# Patient Record
Sex: Female | Born: 1935 | Race: White | Hispanic: No | Smoking: Never smoker
Health system: Southern US, Community
[De-identification: ages and names within clinical notes are randomized; demographics above are authoritative.]

## PROBLEM LIST (undated history)

## (undated) DIAGNOSIS — E785 Hyperlipidemia, unspecified: Secondary | ICD-10-CM

## (undated) DIAGNOSIS — N183 Chronic kidney disease, stage 3 unspecified: Secondary | ICD-10-CM

## (undated) DIAGNOSIS — D469 Myelodysplastic syndrome, unspecified: Secondary | ICD-10-CM

## (undated) DIAGNOSIS — K254 Chronic or unspecified gastric ulcer with hemorrhage: Secondary | ICD-10-CM

## (undated) DIAGNOSIS — K449 Diaphragmatic hernia without obstruction or gangrene: Secondary | ICD-10-CM

## (undated) DIAGNOSIS — D473 Essential (hemorrhagic) thrombocythemia: Secondary | ICD-10-CM

## (undated) DIAGNOSIS — I10 Essential (primary) hypertension: Secondary | ICD-10-CM

## (undated) DIAGNOSIS — R42 Dizziness and giddiness: Secondary | ICD-10-CM

## (undated) DIAGNOSIS — M47816 Spondylosis without myelopathy or radiculopathy, lumbar region: Secondary | ICD-10-CM

## (undated) HISTORY — PX: SHOULDER SURGERY: SHX246

## (undated) HISTORY — DX: Chronic kidney disease, stage 3 unspecified: N18.30

## (undated) HISTORY — PX: HERNIA REPAIR: SHX51

## (undated) HISTORY — DX: Essential (primary) hypertension: I10

## (undated) HISTORY — DX: Spondylosis without myelopathy or radiculopathy, lumbar region: M47.816

## (undated) HISTORY — DX: Dizziness and giddiness: R42

## (undated) HISTORY — DX: Hyperlipidemia, unspecified: E78.5

## (undated) HISTORY — DX: Chronic kidney disease, stage 3 (moderate): N18.3

## (undated) HISTORY — DX: Myelodysplastic syndrome, unspecified: D46.9

## (undated) HISTORY — DX: Essential (hemorrhagic) thrombocythemia: D47.3

## (undated) HISTORY — DX: Chronic or unspecified gastric ulcer with hemorrhage: K25.4

## (undated) HISTORY — DX: Diaphragmatic hernia without obstruction or gangrene: K44.9

---

## 1999-04-06 HISTORY — PX: BREAST BIOPSY: SHX20

## 2004-01-09 ENCOUNTER — Ambulatory Visit: Payer: Self-pay | Admitting: Unknown Physician Specialty

## 2004-01-20 ENCOUNTER — Ambulatory Visit: Payer: Self-pay | Admitting: Oncology

## 2004-01-20 ENCOUNTER — Ambulatory Visit: Payer: Self-pay | Admitting: Unknown Physician Specialty

## 2004-02-04 ENCOUNTER — Ambulatory Visit: Payer: Self-pay | Admitting: Oncology

## 2004-02-10 ENCOUNTER — Ambulatory Visit: Payer: Self-pay | Admitting: Surgery

## 2004-02-12 ENCOUNTER — Ambulatory Visit: Payer: Self-pay | Admitting: Surgery

## 2004-03-05 ENCOUNTER — Ambulatory Visit: Payer: Self-pay | Admitting: Oncology

## 2004-04-05 ENCOUNTER — Ambulatory Visit: Payer: Self-pay | Admitting: Oncology

## 2004-04-05 LAB — HM COLONOSCOPY

## 2004-05-06 ENCOUNTER — Ambulatory Visit: Payer: Self-pay | Admitting: Oncology

## 2004-06-03 ENCOUNTER — Ambulatory Visit: Payer: Self-pay | Admitting: Oncology

## 2004-07-04 ENCOUNTER — Ambulatory Visit: Payer: Self-pay | Admitting: Oncology

## 2004-08-05 ENCOUNTER — Ambulatory Visit: Payer: Self-pay

## 2004-08-06 ENCOUNTER — Ambulatory Visit: Payer: Self-pay | Admitting: Oncology

## 2004-09-03 ENCOUNTER — Ambulatory Visit: Payer: Self-pay | Admitting: Oncology

## 2004-10-03 ENCOUNTER — Ambulatory Visit: Payer: Self-pay | Admitting: Oncology

## 2004-11-12 ENCOUNTER — Ambulatory Visit: Payer: Self-pay | Admitting: Unknown Physician Specialty

## 2004-11-30 ENCOUNTER — Ambulatory Visit: Payer: Self-pay | Admitting: Oncology

## 2004-12-06 ENCOUNTER — Other Ambulatory Visit: Payer: Self-pay

## 2004-12-06 ENCOUNTER — Inpatient Hospital Stay: Payer: Self-pay | Admitting: Internal Medicine

## 2004-12-10 ENCOUNTER — Ambulatory Visit: Payer: Self-pay | Admitting: Oncology

## 2004-12-10 ENCOUNTER — Encounter: Payer: Self-pay | Admitting: Internal Medicine

## 2004-12-22 ENCOUNTER — Other Ambulatory Visit: Payer: Self-pay

## 2004-12-23 ENCOUNTER — Inpatient Hospital Stay: Payer: Self-pay | Admitting: Internal Medicine

## 2004-12-27 ENCOUNTER — Emergency Department: Payer: Self-pay | Admitting: Emergency Medicine

## 2005-01-05 ENCOUNTER — Ambulatory Visit: Payer: Self-pay | Admitting: Unknown Physician Specialty

## 2005-01-06 ENCOUNTER — Encounter: Payer: Self-pay | Admitting: Internal Medicine

## 2005-01-11 ENCOUNTER — Ambulatory Visit: Payer: Self-pay | Admitting: Oncology

## 2005-02-03 ENCOUNTER — Ambulatory Visit: Payer: Self-pay | Admitting: Oncology

## 2005-02-03 ENCOUNTER — Encounter: Payer: Self-pay | Admitting: Internal Medicine

## 2005-03-05 ENCOUNTER — Encounter: Payer: Self-pay | Admitting: Internal Medicine

## 2005-03-05 ENCOUNTER — Ambulatory Visit: Payer: Self-pay | Admitting: Oncology

## 2005-03-19 ENCOUNTER — Ambulatory Visit: Payer: Self-pay | Admitting: Unknown Physician Specialty

## 2005-04-05 ENCOUNTER — Encounter: Payer: Self-pay | Admitting: Internal Medicine

## 2005-04-06 ENCOUNTER — Ambulatory Visit: Payer: Self-pay | Admitting: Oncology

## 2005-05-06 ENCOUNTER — Encounter: Payer: Self-pay | Admitting: Internal Medicine

## 2005-05-06 ENCOUNTER — Ambulatory Visit: Payer: Self-pay | Admitting: Oncology

## 2005-06-03 ENCOUNTER — Encounter: Payer: Self-pay | Admitting: Internal Medicine

## 2005-06-11 ENCOUNTER — Ambulatory Visit: Payer: Self-pay | Admitting: Oncology

## 2005-06-18 ENCOUNTER — Other Ambulatory Visit: Payer: Self-pay

## 2005-06-18 ENCOUNTER — Emergency Department: Payer: Self-pay | Admitting: Emergency Medicine

## 2005-06-22 ENCOUNTER — Ambulatory Visit: Payer: Self-pay | Admitting: Emergency Medicine

## 2005-06-23 ENCOUNTER — Ambulatory Visit: Payer: Self-pay | Admitting: Unknown Physician Specialty

## 2005-07-04 ENCOUNTER — Encounter: Payer: Self-pay | Admitting: Internal Medicine

## 2005-07-04 ENCOUNTER — Ambulatory Visit: Payer: Self-pay | Admitting: Oncology

## 2005-07-30 ENCOUNTER — Ambulatory Visit: Payer: Self-pay | Admitting: Unknown Physician Specialty

## 2005-08-03 ENCOUNTER — Encounter: Payer: Self-pay | Admitting: Internal Medicine

## 2005-08-10 ENCOUNTER — Ambulatory Visit: Payer: Self-pay | Admitting: Oncology

## 2005-09-03 ENCOUNTER — Encounter: Payer: Self-pay | Admitting: Internal Medicine

## 2005-09-03 ENCOUNTER — Ambulatory Visit: Payer: Self-pay | Admitting: Oncology

## 2005-10-03 ENCOUNTER — Ambulatory Visit: Payer: Self-pay | Admitting: Oncology

## 2005-10-03 ENCOUNTER — Encounter: Payer: Self-pay | Admitting: Internal Medicine

## 2005-11-03 ENCOUNTER — Encounter: Payer: Self-pay | Admitting: Internal Medicine

## 2005-11-03 ENCOUNTER — Ambulatory Visit: Payer: Self-pay | Admitting: Oncology

## 2005-12-04 ENCOUNTER — Ambulatory Visit: Payer: Self-pay | Admitting: Oncology

## 2005-12-04 ENCOUNTER — Encounter: Payer: Self-pay | Admitting: Internal Medicine

## 2006-01-03 ENCOUNTER — Encounter: Payer: Self-pay | Admitting: Internal Medicine

## 2006-01-03 ENCOUNTER — Ambulatory Visit: Payer: Self-pay | Admitting: Oncology

## 2006-02-03 ENCOUNTER — Ambulatory Visit: Payer: Self-pay | Admitting: Oncology

## 2006-02-03 ENCOUNTER — Encounter: Payer: Self-pay | Admitting: Internal Medicine

## 2006-03-05 ENCOUNTER — Ambulatory Visit: Payer: Self-pay | Admitting: Oncology

## 2006-04-05 ENCOUNTER — Ambulatory Visit: Payer: Self-pay | Admitting: Oncology

## 2006-05-06 ENCOUNTER — Ambulatory Visit: Payer: Self-pay | Admitting: Oncology

## 2006-06-04 ENCOUNTER — Ambulatory Visit: Payer: Self-pay | Admitting: Oncology

## 2006-07-05 ENCOUNTER — Ambulatory Visit: Payer: Self-pay | Admitting: Oncology

## 2006-08-04 ENCOUNTER — Ambulatory Visit: Payer: Self-pay | Admitting: Oncology

## 2006-09-04 ENCOUNTER — Ambulatory Visit: Payer: Self-pay | Admitting: Oncology

## 2006-10-04 ENCOUNTER — Ambulatory Visit: Payer: Self-pay | Admitting: Oncology

## 2006-10-13 ENCOUNTER — Ambulatory Visit: Payer: Self-pay | Admitting: Unknown Physician Specialty

## 2006-10-25 ENCOUNTER — Ambulatory Visit: Payer: Self-pay | Admitting: Oncology

## 2006-11-04 ENCOUNTER — Ambulatory Visit: Payer: Self-pay | Admitting: Oncology

## 2006-12-05 ENCOUNTER — Ambulatory Visit: Payer: Self-pay | Admitting: Oncology

## 2007-01-04 ENCOUNTER — Ambulatory Visit: Payer: Self-pay | Admitting: Oncology

## 2007-02-04 ENCOUNTER — Ambulatory Visit: Payer: Self-pay | Admitting: Oncology

## 2007-03-06 ENCOUNTER — Ambulatory Visit: Payer: Self-pay | Admitting: Oncology

## 2007-04-06 ENCOUNTER — Ambulatory Visit: Payer: Self-pay | Admitting: Oncology

## 2007-05-07 ENCOUNTER — Ambulatory Visit: Payer: Self-pay | Admitting: Oncology

## 2007-06-04 ENCOUNTER — Ambulatory Visit: Payer: Self-pay | Admitting: Oncology

## 2007-07-05 ENCOUNTER — Ambulatory Visit: Payer: Self-pay | Admitting: Oncology

## 2007-07-07 ENCOUNTER — Ambulatory Visit: Payer: Self-pay | Admitting: Oncology

## 2007-08-04 ENCOUNTER — Ambulatory Visit: Payer: Self-pay | Admitting: Oncology

## 2007-09-04 ENCOUNTER — Ambulatory Visit: Payer: Self-pay | Admitting: Oncology

## 2007-10-04 ENCOUNTER — Ambulatory Visit: Payer: Self-pay | Admitting: Oncology

## 2007-10-19 ENCOUNTER — Ambulatory Visit: Payer: Self-pay | Admitting: Unknown Physician Specialty

## 2007-11-04 ENCOUNTER — Ambulatory Visit: Payer: Self-pay | Admitting: Oncology

## 2007-12-05 ENCOUNTER — Ambulatory Visit: Payer: Self-pay | Admitting: Oncology

## 2007-12-06 ENCOUNTER — Ambulatory Visit: Payer: Self-pay | Admitting: Oncology

## 2008-01-04 ENCOUNTER — Ambulatory Visit: Payer: Self-pay | Admitting: Oncology

## 2008-02-04 ENCOUNTER — Ambulatory Visit: Payer: Self-pay | Admitting: Oncology

## 2008-02-28 ENCOUNTER — Ambulatory Visit: Payer: Self-pay | Admitting: Oncology

## 2008-03-05 ENCOUNTER — Ambulatory Visit: Payer: Self-pay | Admitting: Oncology

## 2008-04-05 ENCOUNTER — Ambulatory Visit: Payer: Self-pay | Admitting: Oncology

## 2008-05-06 ENCOUNTER — Ambulatory Visit: Payer: Self-pay | Admitting: Oncology

## 2008-05-13 ENCOUNTER — Ambulatory Visit: Payer: Self-pay | Admitting: Oncology

## 2008-05-14 ENCOUNTER — Ambulatory Visit: Payer: Self-pay | Admitting: Urology

## 2008-06-03 ENCOUNTER — Ambulatory Visit: Payer: Self-pay | Admitting: Oncology

## 2008-07-04 ENCOUNTER — Ambulatory Visit: Payer: Self-pay | Admitting: Oncology

## 2008-08-03 ENCOUNTER — Ambulatory Visit: Payer: Self-pay | Admitting: Oncology

## 2008-08-07 ENCOUNTER — Ambulatory Visit: Payer: Self-pay | Admitting: Oncology

## 2008-09-03 ENCOUNTER — Ambulatory Visit: Payer: Self-pay | Admitting: Oncology

## 2008-09-03 ENCOUNTER — Encounter: Payer: Self-pay | Admitting: Internal Medicine

## 2008-09-04 ENCOUNTER — Ambulatory Visit: Payer: Self-pay | Admitting: Oncology

## 2008-10-03 ENCOUNTER — Encounter: Payer: Self-pay | Admitting: Internal Medicine

## 2008-10-03 ENCOUNTER — Ambulatory Visit: Payer: Self-pay | Admitting: Oncology

## 2008-10-16 ENCOUNTER — Ambulatory Visit: Payer: Self-pay | Admitting: Oncology

## 2008-11-03 ENCOUNTER — Ambulatory Visit: Payer: Self-pay | Admitting: Oncology

## 2008-11-05 ENCOUNTER — Ambulatory Visit: Payer: Self-pay | Admitting: Unknown Physician Specialty

## 2008-11-06 ENCOUNTER — Ambulatory Visit: Payer: Self-pay | Admitting: Internal Medicine

## 2008-11-20 ENCOUNTER — Ambulatory Visit: Payer: Self-pay | Admitting: Oncology

## 2008-12-04 ENCOUNTER — Ambulatory Visit: Payer: Self-pay | Admitting: Oncology

## 2009-01-03 ENCOUNTER — Ambulatory Visit: Payer: Self-pay | Admitting: Oncology

## 2009-02-03 ENCOUNTER — Ambulatory Visit: Payer: Self-pay | Admitting: Oncology

## 2009-03-05 ENCOUNTER — Ambulatory Visit: Payer: Self-pay | Admitting: Oncology

## 2009-03-19 ENCOUNTER — Ambulatory Visit: Payer: Self-pay | Admitting: Oncology

## 2009-04-05 ENCOUNTER — Ambulatory Visit: Payer: Self-pay | Admitting: Oncology

## 2009-05-06 ENCOUNTER — Ambulatory Visit: Payer: Self-pay | Admitting: Oncology

## 2009-05-07 ENCOUNTER — Ambulatory Visit: Payer: Self-pay | Admitting: Otolaryngology

## 2009-05-14 ENCOUNTER — Ambulatory Visit: Payer: Self-pay | Admitting: Oncology

## 2009-06-03 ENCOUNTER — Ambulatory Visit: Payer: Self-pay | Admitting: Oncology

## 2009-06-05 ENCOUNTER — Encounter: Payer: Self-pay | Admitting: Otolaryngology

## 2009-06-25 ENCOUNTER — Ambulatory Visit: Payer: Self-pay | Admitting: Oncology

## 2009-07-04 ENCOUNTER — Encounter: Payer: Self-pay | Admitting: Otolaryngology

## 2009-07-04 ENCOUNTER — Ambulatory Visit: Payer: Self-pay | Admitting: Oncology

## 2009-08-03 ENCOUNTER — Ambulatory Visit: Payer: Self-pay | Admitting: Oncology

## 2009-08-04 ENCOUNTER — Encounter: Payer: Self-pay | Admitting: Otolaryngology

## 2009-08-14 ENCOUNTER — Ambulatory Visit: Payer: Self-pay | Admitting: Oncology

## 2009-09-03 ENCOUNTER — Ambulatory Visit: Payer: Self-pay | Admitting: Oncology

## 2009-10-03 ENCOUNTER — Ambulatory Visit: Payer: Self-pay | Admitting: Oncology

## 2009-10-14 ENCOUNTER — Ambulatory Visit: Payer: Self-pay | Admitting: Cardiovascular Disease

## 2009-10-14 ENCOUNTER — Inpatient Hospital Stay: Payer: Self-pay | Admitting: Internal Medicine

## 2009-10-23 ENCOUNTER — Ambulatory Visit: Payer: Self-pay | Admitting: Oncology

## 2009-11-03 ENCOUNTER — Ambulatory Visit: Payer: Self-pay | Admitting: Oncology

## 2009-12-03 ENCOUNTER — Ambulatory Visit: Payer: Self-pay | Admitting: Unknown Physician Specialty

## 2009-12-04 ENCOUNTER — Ambulatory Visit: Payer: Self-pay | Admitting: Oncology

## 2010-01-03 ENCOUNTER — Ambulatory Visit: Payer: Self-pay | Admitting: Oncology

## 2010-01-06 ENCOUNTER — Ambulatory Visit: Payer: Self-pay | Admitting: Oncology

## 2010-02-03 ENCOUNTER — Ambulatory Visit: Payer: Self-pay | Admitting: Oncology

## 2010-03-10 ENCOUNTER — Ambulatory Visit: Payer: Self-pay | Admitting: Oncology

## 2010-04-05 ENCOUNTER — Ambulatory Visit: Payer: Self-pay | Admitting: Oncology

## 2010-05-28 ENCOUNTER — Ambulatory Visit: Payer: Self-pay | Admitting: Oncology

## 2010-06-04 ENCOUNTER — Ambulatory Visit: Payer: Self-pay | Admitting: Oncology

## 2010-08-04 ENCOUNTER — Ambulatory Visit: Payer: Self-pay | Admitting: Oncology

## 2010-09-04 ENCOUNTER — Ambulatory Visit: Payer: Self-pay | Admitting: Oncology

## 2010-10-04 ENCOUNTER — Ambulatory Visit: Payer: Self-pay | Admitting: Oncology

## 2010-12-11 ENCOUNTER — Ambulatory Visit: Payer: Self-pay | Admitting: Oncology

## 2010-12-22 ENCOUNTER — Ambulatory Visit: Payer: Self-pay | Admitting: Unknown Physician Specialty

## 2011-01-04 ENCOUNTER — Ambulatory Visit: Payer: Self-pay | Admitting: Oncology

## 2011-03-15 ENCOUNTER — Ambulatory Visit: Payer: Self-pay | Admitting: Oncology

## 2011-03-17 ENCOUNTER — Ambulatory Visit: Payer: Self-pay | Admitting: Unknown Physician Specialty

## 2011-04-06 ENCOUNTER — Ambulatory Visit: Payer: Self-pay | Admitting: Oncology

## 2011-04-22 ENCOUNTER — Ambulatory Visit: Payer: Self-pay | Admitting: Oncology

## 2011-04-22 LAB — COMPREHENSIVE METABOLIC PANEL
Albumin: 3.3 g/dL — ABNORMAL LOW (ref 3.4–5.0)
Anion Gap: 10 (ref 7–16)
Bilirubin,Total: 0.4 mg/dL (ref 0.2–1.0)
Calcium, Total: 8.6 mg/dL (ref 8.5–10.1)
Chloride: 100 mmol/L (ref 98–107)
Co2: 26 mmol/L (ref 21–32)
Creatinine: 1.49 mg/dL — ABNORMAL HIGH (ref 0.60–1.30)
EGFR (Non-African Amer.): 36 — ABNORMAL LOW
Glucose: 90 mg/dL (ref 65–99)
Osmolality: 278 (ref 275–301)
Potassium: 4 mmol/L (ref 3.5–5.1)
SGOT(AST): 19 U/L (ref 15–37)
Sodium: 136 mmol/L (ref 136–145)

## 2011-04-22 LAB — CBC CANCER CENTER
Basophil %: 0.1 %
Eosinophil %: 1 %
HGB: 10.5 g/dL — ABNORMAL LOW (ref 12.0–16.0)
Lymphocyte #: 2.3 x10 3/mm (ref 1.0–3.6)
MCH: 38.1 pg — ABNORMAL HIGH (ref 26.0–34.0)
MCHC: 33.5 g/dL (ref 32.0–36.0)
Monocyte %: 6.7 %
Neutrophil #: 10 x10 3/mm — ABNORMAL HIGH (ref 1.4–6.5)
Neutrophil %: 75 %
Platelet: 1254 x10 3/mm — ABNORMAL HIGH (ref 150–440)
RBC: 2.76 10*6/uL — ABNORMAL LOW (ref 3.80–5.20)

## 2011-04-30 ENCOUNTER — Inpatient Hospital Stay: Payer: Self-pay | Admitting: Internal Medicine

## 2011-04-30 DIAGNOSIS — I059 Rheumatic mitral valve disease, unspecified: Secondary | ICD-10-CM

## 2011-04-30 LAB — CBC
HCT: 30.5 % — ABNORMAL LOW (ref 35.0–47.0)
HGB: 10.7 g/dL — ABNORMAL LOW (ref 12.0–16.0)
MCH: 39.9 pg — ABNORMAL HIGH (ref 26.0–34.0)
MCHC: 35.1 g/dL (ref 32.0–36.0)
MCV: 114 fL — ABNORMAL HIGH (ref 80–100)
WBC: 10.3 10*3/uL (ref 3.6–11.0)

## 2011-04-30 LAB — COMPREHENSIVE METABOLIC PANEL
Albumin: 3.2 g/dL — ABNORMAL LOW (ref 3.4–5.0)
Alkaline Phosphatase: 45 U/L — ABNORMAL LOW (ref 50–136)
Anion Gap: 12 (ref 7–16)
BUN: 26 mg/dL — ABNORMAL HIGH (ref 7–18)
Bilirubin,Total: 0.3 mg/dL (ref 0.2–1.0)
Calcium, Total: 8.9 mg/dL (ref 8.5–10.1)
Chloride: 100 mmol/L (ref 98–107)
Creatinine: 1.48 mg/dL — ABNORMAL HIGH (ref 0.60–1.30)
Glucose: 93 mg/dL (ref 65–99)
Osmolality: 280 (ref 275–301)
SGOT(AST): 27 U/L (ref 15–37)
SGPT (ALT): 18 U/L

## 2011-04-30 LAB — URINALYSIS, COMPLETE
Bilirubin,UR: NEGATIVE
Blood: NEGATIVE
Glucose,UR: NEGATIVE mg/dL (ref 0–75)
Nitrite: NEGATIVE
Ph: 7 (ref 4.5–8.0)
RBC,UR: 3 /HPF (ref 0–5)
Squamous Epithelial: 1
WBC UR: 99 /HPF (ref 0–5)

## 2011-04-30 LAB — LIPASE, BLOOD: Lipase: 100 U/L (ref 73–393)

## 2011-05-01 LAB — CBC WITH DIFFERENTIAL/PLATELET
Basophil #: 0 10*3/uL (ref 0.0–0.1)
Eosinophil #: 0.3 10*3/uL (ref 0.0–0.7)
MCH: 37.9 pg — ABNORMAL HIGH (ref 26.0–34.0)
MCHC: 33.1 g/dL (ref 32.0–36.0)
Monocyte #: 1 10*3/uL — ABNORMAL HIGH (ref 0.0–0.7)
Monocyte %: 10.4 %
Neutrophil %: 66 %
Platelet: 813 10*3/uL — ABNORMAL HIGH (ref 150–440)
RBC: 2.49 10*6/uL — ABNORMAL LOW (ref 3.80–5.20)
RDW: 15.4 % — ABNORMAL HIGH (ref 11.5–14.5)

## 2011-05-01 LAB — BASIC METABOLIC PANEL
BUN: 22 mg/dL — ABNORMAL HIGH (ref 7–18)
Chloride: 100 mmol/L (ref 98–107)
Creatinine: 1.27 mg/dL (ref 0.60–1.30)
Potassium: 4.1 mmol/L (ref 3.5–5.1)

## 2011-05-01 LAB — CK TOTAL AND CKMB (NOT AT ARMC)
CK, Total: 33 U/L (ref 21–215)
CK-MB: 1.2 ng/mL (ref 0.5–3.6)

## 2011-05-02 DIAGNOSIS — R748 Abnormal levels of other serum enzymes: Secondary | ICD-10-CM

## 2011-05-07 ENCOUNTER — Ambulatory Visit (INDEPENDENT_AMBULATORY_CARE_PROVIDER_SITE_OTHER): Payer: Medicare Other | Admitting: Internal Medicine

## 2011-05-07 ENCOUNTER — Ambulatory Visit: Payer: Self-pay | Admitting: Oncology

## 2011-05-07 ENCOUNTER — Encounter: Payer: Self-pay | Admitting: Internal Medicine

## 2011-05-07 VITALS — BP 112/62 | HR 74 | Temp 97.4°F | Ht 61.0 in | Wt 125.0 lb

## 2011-05-07 DIAGNOSIS — R209 Unspecified disturbances of skin sensation: Secondary | ICD-10-CM

## 2011-05-07 DIAGNOSIS — R200 Anesthesia of skin: Secondary | ICD-10-CM | POA: Insufficient documentation

## 2011-05-07 DIAGNOSIS — D510 Vitamin B12 deficiency anemia due to intrinsic factor deficiency: Secondary | ICD-10-CM

## 2011-05-07 DIAGNOSIS — K254 Chronic or unspecified gastric ulcer with hemorrhage: Secondary | ICD-10-CM | POA: Insufficient documentation

## 2011-05-07 DIAGNOSIS — E538 Deficiency of other specified B group vitamins: Secondary | ICD-10-CM | POA: Insufficient documentation

## 2011-05-07 DIAGNOSIS — R202 Paresthesia of skin: Secondary | ICD-10-CM | POA: Insufficient documentation

## 2011-05-07 DIAGNOSIS — D473 Essential (hemorrhagic) thrombocythemia: Secondary | ICD-10-CM | POA: Insufficient documentation

## 2011-05-07 DIAGNOSIS — E119 Type 2 diabetes mellitus without complications: Secondary | ICD-10-CM | POA: Insufficient documentation

## 2011-05-07 DIAGNOSIS — R350 Frequency of micturition: Secondary | ICD-10-CM

## 2011-05-07 DIAGNOSIS — E039 Hypothyroidism, unspecified: Secondary | ICD-10-CM | POA: Insufficient documentation

## 2011-05-07 LAB — CBC WITH DIFFERENTIAL/PLATELET
Basophils Absolute: 0 10*3/uL (ref 0.0–0.1)
Eosinophils Absolute: 0.2 10*3/uL (ref 0.0–0.7)
Hemoglobin: 9.6 g/dL — ABNORMAL LOW (ref 12.0–15.0)
Lymphocytes Relative: 16.3 % (ref 12.0–46.0)
MCHC: 33.2 g/dL (ref 30.0–36.0)
Monocytes Relative: 10.6 % (ref 3.0–12.0)
Neutro Abs: 4.7 10*3/uL (ref 1.4–7.7)
Neutrophils Relative %: 70.5 % (ref 43.0–77.0)
Platelets: 642 10*3/uL — ABNORMAL HIGH (ref 150.0–400.0)
RDW: 15.7 % — ABNORMAL HIGH (ref 11.5–14.6)

## 2011-05-07 LAB — COMPREHENSIVE METABOLIC PANEL
ALT: 15 U/L (ref 0–35)
AST: 25 U/L (ref 0–37)
CO2: 27 mEq/L (ref 19–32)
Calcium: 8.7 mg/dL (ref 8.4–10.5)
Chloride: 100 mEq/L (ref 96–112)
Creatinine, Ser: 1.4 mg/dL — ABNORMAL HIGH (ref 0.4–1.2)
Sodium: 134 mEq/L — ABNORMAL LOW (ref 135–145)
Total Protein: 7 g/dL (ref 6.0–8.3)

## 2011-05-07 LAB — VITAMIN B12: Vitamin B-12: 780 pg/mL (ref 211–911)

## 2011-05-07 MED ORDER — CYANOCOBALAMIN 1000 MCG/ML IJ SOLN
1000.0000 ug | Freq: Once | INTRAMUSCULAR | Status: AC
  Administered 2011-05-07: 1000 ug via INTRAMUSCULAR

## 2011-05-07 NOTE — Assessment & Plan Note (Signed)
Unclear etiology. Patient has history of hypothyroidism, so will check TSH with labs today. She also has history of B12 deficiency so will check B12 level. She reports elevated blood sugars and question history of diabetes, will check A1c with labs today. She will followup in 2 weeks.

## 2011-05-07 NOTE — Assessment & Plan Note (Signed)
Patient reports elevated blood sugar of over 170 while hospitalized. No known history of diabetes. Will check screening hemoglobin A1c with labs today. Question of undiagnosed diabetes might be contributing to numbness in her lower extremities.

## 2011-05-07 NOTE — Assessment & Plan Note (Signed)
Patient status post recent gastric ulcer. She is currently taking protonix twice daily. She recently had followup with GI. Encouraged her to continue with bland diet. Will repeat CBC with labs today. She will call or return to clinic if any recurrent symptoms.

## 2011-05-07 NOTE — Assessment & Plan Note (Signed)
Will get records from patient's oncologist. Will check CBC with labs today.

## 2011-05-07 NOTE — Progress Notes (Signed)
Subjective:    Patient ID: Tanya Rice, female    DOB: 10-Mar-1927, 78 y.o.   MRN: [id number]  HPI 78 year old female with a history of HTN, hypothyroidism, MDS, and recent gastric ulcer and hemorrhage presents to establish care. She was admitted to the hospital last week with severe epigastric pain an upper endoscopy noted large cratered gastric ulcer. She has been followed by GI and was started on twice-daily Protonix. She reports improvement in her symptoms, however intermittently has some epigastric discomfort. She denies any black tarry stools or blood in her stool. She denies any nausea or vomiting. She continues to eat a bland diet.  She is primarily concerned today about some numbness in her bilateral lower extremities. This has been present intermittently for several weeks. Notably, she has a history of pernicious anemia and hypothyroidism. She also reports elevated blood sugars during her hospitalization as high as 170. She has never been diagnosed with diabetes. She also notes some swelling in her lower extremities which has been intermittent over the last several days. She denies any shortness of breath, chest pain, wounds on her lower extremities.  She is also very concerned today about recent increase in her platelet count. She notes a history of ET. She is followed by oncology. She typically has platelet counts near 600, however over the last month has noted count as high as 1200. She is concerned about risk of clots with elevated platelet count. She notes that she has followup with her oncologist next week. She is currently taking hydroxyurea.  Outpatient Encounter Prescriptions as of 05/07/2011  Medication Sig Dispense Refill  . aspirin EC 81 MG tablet Take 81 mg by mouth daily.      . Calcium Carbonate-Vitamin D (CALCIUM + D PO) Take by mouth daily.      . eszopiclone (LUNESTA) 2 MG TABS Take 2 mg by mouth at bedtime as needed and may repeat dose one time if needed. Take  immediately before bedtime      . fexofenadine (ALLEGRA) 180 MG tablet Take 180 mg by mouth daily.      . hydroxyurea (HYDREA) 500 MG capsule Take 500 mg by mouth as directed. TAKE 2 on Monday, Wednesday & Friday, 1 all other days      . levothyroxine (SYNTHROID, LEVOTHROID) 25 MCG tablet Take 25 mcg by mouth daily.      . meclizine (ANTIVERT) 25 MG tablet Take 25 mg by mouth 3 (three) times daily as needed.      . metoprolol tartrate (LOPRESSOR) 25 MG tablet Take 25 mg by mouth 2 (two) times daily.      . Multiple Vitamins-Minerals (MULTIVITAMIN WITH MINERALS) tablet Take 1 tablet by mouth daily.      . pantoprazole (PROTONIX) 40 MG tablet Take 40 mg by mouth 2 (two) times daily.       Facility-Administered Encounter Medications as of 05/07/2011  Medication Dose Route Frequency Provider Last Rate Last Dose  . cyanocobalamin ((VITAMIN B-12)) injection 1,000 mcg  1,000 mcg Intramuscular Once Shelia Media, MD   1,000 mcg at 05/07/11 1535    Review of Systems  Constitutional: Negative for fever, chills, appetite change, fatigue and unexpected weight change.  HENT: Negative for ear pain, congestion, sore throat, trouble swallowing, neck pain, voice change and sinus pressure.   Eyes: Negative for visual disturbance.  Respiratory: Negative for cough, shortness of breath, wheezing and stridor.   Cardiovascular: Positive for leg swelling. Negative for chest pain and palpitations.  Gastrointestinal:  Positive for abdominal pain. Negative for nausea, vomiting, diarrhea, constipation, blood in stool, abdominal distention and anal bleeding.  Genitourinary: Negative for dysuria and flank pain.  Musculoskeletal: Negative for myalgias, arthralgias and gait problem.  Skin: Negative for color change and rash.  Neurological: Positive for numbness. Negative for dizziness and headaches.  Hematological: Negative for adenopathy. Does not bruise/bleed easily.  Psychiatric/Behavioral: Negative for suicidal  ideas, sleep disturbance and dysphoric mood. The patient is not nervous/anxious.    BP 112/62  Pulse 74  Temp(Src) 97.4 F (36.3 C) (Oral)  Ht 5\' 1"  (1.549 m)  Wt 125 lb (56.7 kg)  BMI 23.62 kg/m2  SpO2 97%     Objective:   Physical Exam  Constitutional: She is oriented to person, place, and time. She appears well-developed and well-nourished. No distress.  HENT:  Head: Normocephalic and atraumatic.  Right Ear: External ear normal.  Left Ear: External ear normal.  Nose: Nose normal.  Mouth/Throat: Oropharynx is clear and moist. No oropharyngeal exudate.  Eyes: Conjunctivae are normal. Pupils are equal, round, and reactive to light. Right eye exhibits no discharge. Left eye exhibits no discharge. No scleral icterus.  Neck: Normal range of motion. Neck supple. No tracheal deviation present. No thyromegaly present.  Cardiovascular: Normal rate, regular rhythm, normal heart sounds and intact distal pulses.  Exam reveals no gallop and no friction rub.   No murmur heard. Pulmonary/Chest: Effort normal and breath sounds normal. No respiratory distress. She has no wheezes. She has no rales. She exhibits no tenderness.  Abdominal: Soft. Bowel sounds are normal. She exhibits no distension and no mass. There is tenderness (epigastric area). There is no rebound and no guarding.  Musculoskeletal: Normal range of motion. She exhibits edema (trace bilateral ankles). She exhibits no tenderness.  Lymphadenopathy:    She has no cervical adenopathy.  Neurological: She is alert and oriented to person, place, and time. No cranial nerve deficit. She exhibits normal muscle tone. Coordination normal.  Skin: Skin is warm and dry. No rash noted. She is not diaphoretic. No erythema. No pallor.  Psychiatric: She has a normal mood and affect. Her behavior is normal. Judgment and thought content normal.          Assessment & Plan:

## 2011-05-07 NOTE — Assessment & Plan Note (Signed)
Chronic, per patient Patient has scheduled followup with her urologist but would like a second opinion. Will set up second opinion with Dr. Orson Slick.

## 2011-05-07 NOTE — Assessment & Plan Note (Signed)
Patient is due for B12 shot. Will check B12 level today and give B12 shot. Question of low B12 level may be contributing to numbness in her lower extremities.

## 2011-05-07 NOTE — Assessment & Plan Note (Signed)
Will check TSH with labs today. Followup 2 weeks.

## 2011-05-14 ENCOUNTER — Ambulatory Visit (INDEPENDENT_AMBULATORY_CARE_PROVIDER_SITE_OTHER): Payer: Medicare Other | Admitting: Internal Medicine

## 2011-05-14 ENCOUNTER — Encounter: Payer: Self-pay | Admitting: Internal Medicine

## 2011-05-14 VITALS — BP 128/80 | HR 85 | Temp 97.6°F | Ht 61.0 in | Wt 125.0 lb

## 2011-05-14 DIAGNOSIS — G629 Polyneuropathy, unspecified: Secondary | ICD-10-CM | POA: Insufficient documentation

## 2011-05-14 DIAGNOSIS — G589 Mononeuropathy, unspecified: Secondary | ICD-10-CM

## 2011-05-14 DIAGNOSIS — K254 Chronic or unspecified gastric ulcer with hemorrhage: Secondary | ICD-10-CM

## 2011-05-14 MED ORDER — GABAPENTIN 100 MG PO CAPS: ORAL_CAPSULE | ORAL | Status: DC

## 2011-05-14 NOTE — Progress Notes (Signed)
Subjective:    Patient ID: Tanya Rice, female    DOB: May 05, 1926, 78 y.o.   MRN: [id number]  HPI 78YO female with h/o gastric hemorrhage secondary to ulcer, essential thrombocythemia, pernicious anemia, hypothyroidism presents for follow up.  Her primary concern today is bilateral lower extremity numbness and burning. This has been presents for several months and is progressive.  She has been evaluated by orthopedics and plain films reportedly showed DJD.  She was started on neurontin to help with pain, however has had minimal improvement.  Pain/burning is keeping her up at night. She denies weakness in her legs. Recent TSH and B12 were normal.    In regards to recent gastric hemorrhage, she denies any recent abdominal pain or bleeding.  She is tolerating her diet (bland food) without problems.  Outpatient Encounter Prescriptions as of 05/14/2011  Medication Sig Dispense Refill  . aspirin EC 81 MG tablet Take 81 mg by mouth daily.      . Calcium Carbonate-Vitamin D (CALCIUM + D PO) Take by mouth daily.      . eszopiclone (LUNESTA) 2 MG TABS Take 2 mg by mouth at bedtime as needed and may repeat dose one time if needed. Take immediately before bedtime      . fexofenadine (ALLEGRA) 180 MG tablet Take 180 mg by mouth daily.      Marland Kitchen gabapentin (NEURONTIN) 100 MG capsule Take 1 tablet every morning, 1 tablet at lunch and 2 tablets at bedtime.  120 capsule  3  . gabapentin (NEURONTIN) 100 MG capsule Take 1 tablet at breakfast and lunch and 2 tablets at bedtime.  120 capsule  3  . hydroxyurea (HYDREA) 500 MG capsule Take 500 mg by mouth as directed. TAKE 2 on Monday, Wednesday & Friday, 1 all other days      . levothyroxine (SYNTHROID, LEVOTHROID) 25 MCG tablet Take 25 mcg by mouth daily.      . meclizine (ANTIVERT) 25 MG tablet Take 25 mg by mouth 3 (three) times daily as needed.      . metoprolol tartrate (LOPRESSOR) 25 MG tablet Take 25 mg by mouth 2 (two) times daily.      . Multiple  Vitamins-Minerals (MULTIVITAMIN WITH MINERALS) tablet Take 1 tablet by mouth daily.      . pantoprazole (PROTONIX) 40 MG tablet Take 40 mg by mouth 2 (two) times daily.      Marland Kitchen DISCONTD: gabapentin (NEURONTIN) 100 MG capsule Take 100 mg by mouth 3 (three) times daily.        Review of Systems  Constitutional: Negative for fever, chills, appetite change, fatigue and unexpected weight change.  HENT: Negative for ear pain, congestion, sore throat, trouble swallowing, neck pain, voice change and sinus pressure.   Eyes: Negative for visual disturbance.  Respiratory: Negative for cough, shortness of breath, wheezing and stridor.   Cardiovascular: Negative for chest pain, palpitations and leg swelling.  Gastrointestinal: Negative for nausea, vomiting, abdominal pain, diarrhea, constipation, blood in stool, abdominal distention and anal bleeding.  Genitourinary: Negative for dysuria and flank pain.  Musculoskeletal: Positive for myalgias and arthralgias. Negative for gait problem.  Skin: Negative for color change and rash.  Neurological: Positive for numbness. Negative for dizziness, tremors, weakness and headaches.  Hematological: Negative for adenopathy. Does not bruise/bleed easily.  Psychiatric/Behavioral: Negative for suicidal ideas, sleep disturbance and dysphoric mood. The patient is not nervous/anxious.    BP 128/80  Pulse 85  Temp(Src) 97.6 F (36.4 C) (Oral)  Ht 5\' 1"  (  1.549 m)  Wt 125 lb (56.7 kg)  BMI 23.62 kg/m2  SpO2 96%     Objective:   Physical Exam  Constitutional: She is oriented to person, place, and time. She appears well-developed and well-nourished. No distress.  HENT:  Head: Normocephalic and atraumatic.  Right Ear: External ear normal.  Left Ear: External ear normal.  Nose: Nose normal.  Mouth/Throat: Oropharynx is clear and moist. No oropharyngeal exudate.  Eyes: Conjunctivae are normal. Pupils are equal, round, and reactive to light. Right eye exhibits no  discharge. Left eye exhibits no discharge. No scleral icterus.  Neck: Normal range of motion. Neck supple. No tracheal deviation present. No thyromegaly present.  Cardiovascular: Normal rate, regular rhythm, normal heart sounds and intact distal pulses.  Exam reveals no gallop and no friction rub.   No murmur heard. Pulmonary/Chest: Effort normal and breath sounds normal. No respiratory distress. She has no wheezes. She has no rales. She exhibits no tenderness.  Musculoskeletal: Normal range of motion. She exhibits no edema and no tenderness.  Lymphadenopathy:    She has no cervical adenopathy.  Neurological: She is alert and oriented to person, place, and time. No cranial nerve deficit. She exhibits normal muscle tone. Coordination normal.  Skin: Skin is warm and dry. No rash noted. She is not diaphoretic. No erythema. No pallor.  Psychiatric: She has a normal mood and affect. Her behavior is normal. Judgment and thought content normal.          Assessment & Plan:

## 2011-05-14 NOTE — Assessment & Plan Note (Signed)
Recent Hgb stable.  No further abdominal pain or bleeding. Will continue to monitor.

## 2011-05-14 NOTE — Assessment & Plan Note (Signed)
Likely multifactorial. B12 level normal on recent labs, TSH normal.  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 Followed by ortho and was started on Neurontin. Will try advancing dose of neurontin.  Will also set up MRI lumbar spine to better assess nerve compression. Consider EMG testing if MRI unrevealing. Follow up 2 weeks.

## 2011-05-19 ENCOUNTER — Ambulatory Visit: Payer: Self-pay | Admitting: Internal Medicine

## 2011-05-20 ENCOUNTER — Ambulatory Visit: Payer: Self-pay | Admitting: Oncology

## 2011-05-20 LAB — COMPREHENSIVE METABOLIC PANEL
Albumin: 3.6 g/dL (ref 3.4–5.0)
Alkaline Phosphatase: 77 U/L (ref 50–136)
Anion Gap: 7 (ref 7–16)
BUN: 35 mg/dL — ABNORMAL HIGH (ref 7–18)
Co2: 29 mmol/L (ref 21–32)
Creatinine: 1.8 mg/dL — ABNORMAL HIGH (ref 0.60–1.30)
Glucose: 90 mg/dL (ref 65–99)
Potassium: 4.6 mmol/L (ref 3.5–5.1)
SGPT (ALT): 20 U/L
Sodium: 136 mmol/L (ref 136–145)
Total Protein: 7.7 g/dL (ref 6.4–8.2)

## 2011-05-20 LAB — CBC CANCER CENTER
Basophil #: 0 x10 3/mm (ref 0.0–0.1)
Basophil %: 0.3 %
Eosinophil #: 0.1 x10 3/mm (ref 0.0–0.7)
HGB: 10.1 g/dL — ABNORMAL LOW (ref 12.0–16.0)
Lymphocyte #: 1.3 x10 3/mm (ref 1.0–3.6)
Lymphocyte %: 19.8 %
MCHC: 33.3 g/dL (ref 32.0–36.0)
Monocyte %: 11.1 %
Neutrophil #: 4.3 x10 3/mm (ref 1.4–6.5)
Neutrophil %: 66.6 %
RDW: 15.6 % — ABNORMAL HIGH (ref 11.5–14.5)
WBC: 6.5 x10 3/mm (ref 3.6–11.0)

## 2011-05-21 ENCOUNTER — Telehealth: Payer: Self-pay | Admitting: Internal Medicine

## 2011-05-21 ENCOUNTER — Encounter: Payer: Self-pay | Admitting: *Deleted

## 2011-05-21 DIAGNOSIS — R935 Abnormal findings on diagnostic imaging of other abdominal regions, including retroperitoneum: Secondary | ICD-10-CM

## 2011-05-21 NOTE — Telephone Encounter (Signed)
MRI of the lumbar spine showed several areas of degenerative changes. I would like for her to follow up with her orthopedic surgeon to review these films. Incidentally on MRI they saw a cystic area between the aorta and IVC and recommended CT for further evaluation.  We need to set up CT abdomen and pelvis with contrast for evaluation.

## 2011-05-21 NOTE — Telephone Encounter (Signed)
Patient says that she saw Dr. Kary Kos yesterday and he went over the MRI results while she was there and he sent her for CT today

## 2011-05-24 ENCOUNTER — Telehealth: Payer: Self-pay | Admitting: Internal Medicine

## 2011-05-24 ENCOUNTER — Encounter: Payer: Self-pay | Admitting: *Deleted

## 2011-05-24 ENCOUNTER — Ambulatory Visit (INDEPENDENT_AMBULATORY_CARE_PROVIDER_SITE_OTHER): Payer: Medicare Other | Admitting: Cardiovascular Disease

## 2011-05-24 DIAGNOSIS — R079 Chest pain, unspecified: Secondary | ICD-10-CM | POA: Insufficient documentation

## 2011-05-24 DIAGNOSIS — K254 Chronic or unspecified gastric ulcer with hemorrhage: Secondary | ICD-10-CM

## 2011-05-24 DIAGNOSIS — D473 Essential (hemorrhagic) thrombocythemia: Secondary | ICD-10-CM

## 2011-05-24 NOTE — Patient Instructions (Signed)
You are doing well. No medication changes were made. Please call us if you have new issues that need to be addressed    

## 2011-05-24 NOTE — Assessment & Plan Note (Signed)
She has followup with hematology this week for bone marrow biopsy. She does have anemia with thrombocytosis.

## 2011-05-24 NOTE — Assessment & Plan Note (Signed)
Stable

## 2011-05-24 NOTE — Assessment & Plan Note (Signed)
Aspririn is on hold. She is on a PPI.

## 2011-05-24 NOTE — Progress Notes (Signed)
Patient ID: Tanya Rice, female    DOB: [birth year], 78 y.o.   MRN: [id number]  HPI Comments: Tanya Rice is a very pleasant 78 year old woman with history of chronic kidney disease, stage III, baseline creatinine of 1.4-1.6, myeloproliferative disorder with thrombocytosis, on chronic hydroxyurea treatment, history of gastric ulcers, chronic low back pain, hypertension, hyperlipidemia who presented to [facility] at the end of January with epigastric pain, elevated cardiac enzymes. Cardiology was consulted for her symptoms and abnormal blood work.  She reports that she had been on a prednisone taper for back pain radiating to her right leg also with gabapentin. Cardiac enzymes did not significantly change, and rule out was done. Echocardiogram showed moderate LVH, normal LV systolic function, evidence of diastolic dysfunction, normal right ventricular systolic pressures.  She had an EGD shortly after her admission that showed a gastric ulcer. She is currently taking high-dose proton pump inhibitor. She has followup with Dr. Corlis Leak for workup of her myeloproliferative disorder. She reports that she will probably need a bone marrow biopsy later this week. She reports having recent CT scan and MRI of her back and has followup with Dr. Dan Humphreys on February 22.  EKG On April 30, 2011 shows normal sinus rhythm with rate 71 beats per minute, nonspecific T-wave abnormality in V3, V4, poor R-wave regression to the anterior precordial leads  Outpatient Encounter Prescriptions as of 05/24/2011  Medication Sig Dispense Refill  . Calcium Carbonate-Vitamin D (CALCIUM + D PO) Take by mouth daily.      . eszopiclone (LUNESTA) 2 MG TABS Take 2 mg by mouth at bedtime as needed and may repeat dose one time if needed. Take immediately before bedtime      . fexofenadine (ALLEGRA) 180 MG tablet Take 180 mg by mouth daily.      Marland Kitchen gabapentin (NEURONTIN) 100 MG capsule Take 1 tablet at breakfast and lunch and 2 tablets at  bedtime.  120 capsule  3  . hydroxyurea (HYDREA) 500 MG capsule Take 500 mg by mouth as directed. TAKE 2 on Monday, Wednesday & Friday, 1 all other days      . levothyroxine (SYNTHROID, LEVOTHROID) 25 MCG tablet Take 25 mcg by mouth daily.      . meclizine (ANTIVERT) 25 MG tablet Take 25 mg by mouth 3 (three) times daily as needed.      . metoprolol tartrate (LOPRESSOR) 25 MG tablet Take 25 mg by mouth 2 (two) times daily.      . Multiple Vitamins-Minerals (MULTIVITAMIN WITH MINERALS) tablet Take 1 tablet by mouth daily.      . pantoprazole (PROTONIX) 40 MG tablet Take 40 mg by mouth 2 (two) times daily.      Marland Kitchen DISCONTD: gabapentin (NEURONTIN) 100 MG capsule Take 1 tablet every morning, 1 tablet at lunch and 2 tablets at bedtime.  120 capsule  3    Review of Systems  Constitutional: Negative.   HENT: Negative.   Eyes: Negative.   Respiratory: Negative.   Cardiovascular: Negative.   Gastrointestinal: Negative.   Musculoskeletal: Negative.   Skin: Negative.   Neurological: Negative.   Hematological: Negative.   Psychiatric/Behavioral: Negative.   All other systems reviewed and are negative.    BP 124/78  Pulse 77  Ht 5\' 1"  (1.549 m)  Wt 125 lb (56.7 kg)  BMI 23.62 kg/m2  Physical Exam  Nursing note and vitals reviewed. Constitutional: She is oriented to person, place, and time. She appears well-developed and well-nourished.  HENT:  Head: Normocephalic.  Nose: Nose normal.  Mouth/Throat: Oropharynx is clear and moist.  Eyes: Conjunctivae are normal. Pupils are equal, round, and reactive to light.  Neck: Normal range of motion. Neck supple. No JVD present.  Cardiovascular: Normal rate, regular rhythm, S1 normal, S2 normal, normal heart sounds and intact distal pulses.  Exam reveals no gallop and no friction rub.   No murmur heard. Pulmonary/Chest: Effort normal and breath sounds normal. No respiratory distress. She has no wheezes. She has no rales. She exhibits no tenderness.    Abdominal: Soft. Bowel sounds are normal. She exhibits no distension. There is no tenderness.  Musculoskeletal: Normal range of motion. She exhibits no edema and no tenderness.  Lymphadenopathy:    She has no cervical adenopathy.  Neurological: She is alert and oriented to person, place, and time. Coordination normal.  Skin: Skin is warm and dry. No rash noted. No erythema.  Psychiatric: She has a normal mood and affect. Her behavior is normal. Judgment and thought content normal.         Assessment and Plan

## 2011-05-24 NOTE — Assessment & Plan Note (Signed)
Recent episode of chest and epigastric discomfort with admission to the hospital, felt secondary to her gastric ulcer. No further symptoms. She is taking high-dose proton pump inhibitor. No further workup needed at this time

## 2011-05-26 NOTE — Telephone Encounter (Signed)
We can set her up with Dr. Ernest Pine.  He typically specializes in knees, though, and a better option might be Dr. Yves Dill, who specializes in chronic back pain. Either one is fine, though.

## 2011-05-27 ENCOUNTER — Ambulatory Visit (INDEPENDENT_AMBULATORY_CARE_PROVIDER_SITE_OTHER): Payer: Medicare Other | Admitting: Internal Medicine

## 2011-05-27 ENCOUNTER — Encounter: Payer: Self-pay | Admitting: Internal Medicine

## 2011-05-27 DIAGNOSIS — G589 Mononeuropathy, unspecified: Secondary | ICD-10-CM

## 2011-05-27 DIAGNOSIS — K259 Gastric ulcer, unspecified as acute or chronic, without hemorrhage or perforation: Secondary | ICD-10-CM

## 2011-05-27 DIAGNOSIS — R42 Dizziness and giddiness: Secondary | ICD-10-CM

## 2011-05-27 DIAGNOSIS — E538 Deficiency of other specified B group vitamins: Secondary | ICD-10-CM

## 2011-05-27 DIAGNOSIS — G629 Polyneuropathy, unspecified: Secondary | ICD-10-CM

## 2011-05-27 DIAGNOSIS — D473 Essential (hemorrhagic) thrombocythemia: Secondary | ICD-10-CM

## 2011-05-27 DIAGNOSIS — R935 Abnormal findings on diagnostic imaging of other abdominal regions, including retroperitoneum: Secondary | ICD-10-CM

## 2011-05-27 MED ORDER — CYANOCOBALAMIN 1000 MCG/ML IJ SOLN
1000.0000 ug | Freq: Once | INTRAMUSCULAR | Status: AC
  Administered 2011-05-27: 1000 ug via INTRAMUSCULAR

## 2011-05-27 MED ORDER — PANTOPRAZOLE SODIUM 40 MG PO TBEC
40.0000 mg | DELAYED_RELEASE_TABLET | Freq: Two times a day (BID) | ORAL | Status: DC
Start: 2011-05-27 — End: 2012-01-03

## 2011-05-27 NOTE — Telephone Encounter (Signed)
Pt in office now to discuss

## 2011-05-28 ENCOUNTER — Encounter: Payer: Self-pay | Admitting: Internal Medicine

## 2011-05-28 ENCOUNTER — Encounter: Payer: Self-pay | Admitting: Neurology

## 2011-05-28 ENCOUNTER — Ambulatory Visit: Payer: Medicare Other | Admitting: Internal Medicine

## 2011-05-28 DIAGNOSIS — R935 Abnormal findings on diagnostic imaging of other abdominal regions, including retroperitoneum: Secondary | ICD-10-CM | POA: Insufficient documentation

## 2011-05-28 NOTE — Assessment & Plan Note (Signed)
MRI of the lumbar spine incidentally showed a cystic area between the IVC and aorta. CT abdomen was performed, again showing a cystic area at this location, measuring approximately 1.5 cm in diameter. Will plan to repeat CT of the abdomen in 6 months, August 2013, to document stability.

## 2011-05-28 NOTE — Assessment & Plan Note (Signed)
Symptoms recently worsening. MRI of the lumbar spine does show degenerative changes with nerve compression at L4-L5, however not clear that this is the sole cause of her numbness. Will set her back up with orthopedics to see if any intervention would be helpful. Will continue Neurontin. Will set her up with neurology to see if EMG testing would be helpful.

## 2011-05-28 NOTE — Progress Notes (Signed)
Subjective:    Patient ID: Tanya Rice, female    DOB: Sep 19, 1926, 78 y.o.   MRN: [id number]  HPI 78 year old female with history of essential thrombocytosis, recent gastric ulcer with hemorrhage, neuropathy presents for followup. Her primary concern today is progressive neuropathy described as numbness in both of her feet. Occasionally she has burning or pinprick sensation, mostly across the tops of her feet. She recently had MRI of her lumbar spine which showed degenerative changes and some nerve compression at L4-L5. She has been followed by orthopedics and was recently placed on a prednisone pack in effort to improve symptoms. She had some improvement in symptoms, but then developed gastric ulcer with hemorrhage. She is currently taking Neurontin for her pain. She reports minimal improvement with this.  In regards to her essential thrombocytosis, she notes that her hematologist is planning to perform a bone marrow biopsy for further evaluation next week. Her recent platelet count was improved at 640,000.  In regards to her recent gastric ulcer, she denies any recurrent abdominal pain, vomiting, or change in bowel habits. She notes that she has followup with her GI physician next week. She continues on proton pump inhibitor twice daily.  Outpatient Encounter Prescriptions as of 05/27/2011  Medication Sig Dispense Refill  . aspirin EC 81 MG tablet Take 81 mg by mouth daily. On hold (05/24/11)      . Calcium Carbonate-Vitamin D (CALCIUM + D PO) Take by mouth daily.      . eszopiclone (LUNESTA) 2 MG TABS Take 2 mg by mouth at bedtime as needed and may repeat dose one time if needed. Take immediately before bedtime      . fexofenadine (ALLEGRA) 180 MG tablet Take 180 mg by mouth daily.      Marland Kitchen gabapentin (NEURONTIN) 100 MG capsule Take 1 tablet at breakfast and lunch and 2 tablets at bedtime.  120 capsule  3  . hydroxyurea (HYDREA) 500 MG capsule Take 500 mg by mouth as directed. TAKE 2 on Monday,  Wednesday & Friday, 1 all other days      . levothyroxine (SYNTHROID, LEVOTHROID) 25 MCG tablet Take 25 mcg by mouth daily.      . meclizine (ANTIVERT) 25 MG tablet Take 25 mg by mouth 3 (three) times daily as needed.      . metoprolol tartrate (LOPRESSOR) 25 MG tablet Take 25 mg by mouth 2 (two) times daily.      . Multiple Vitamins-Minerals (MULTIVITAMIN WITH MINERALS) tablet Take 1 tablet by mouth daily.      . pantoprazole (PROTONIX) 40 MG tablet Take 1 tablet (40 mg total) by mouth 2 (two) times daily.  180 tablet  1  . DISCONTD: pantoprazole (PROTONIX) 40 MG tablet Take 40 mg by mouth 2 (two) times daily.       Facility-Administered Encounter Medications as of 05/27/2011  Medication Dose Route Frequency Provider Last Rate Last Dose  . cyanocobalamin ((VITAMIN B-12)) injection 1,000 mcg  1,000 mcg Intramuscular Once Shelia Media, MD   1,000 mcg at 05/27/11 1349    Review of Systems  Constitutional: Negative for fever, chills, appetite change, fatigue and unexpected weight change.  HENT: Negative for ear pain, congestion, sore throat, trouble swallowing, neck pain, voice change and sinus pressure.   Eyes: Negative for visual disturbance.  Respiratory: Negative for cough, shortness of breath, wheezing and stridor.   Cardiovascular: Negative for chest pain, palpitations and leg swelling.  Gastrointestinal: Negative for nausea, vomiting, abdominal pain, diarrhea, constipation, blood  in stool, abdominal distention and anal bleeding.  Genitourinary: Negative for dysuria and flank pain.  Musculoskeletal: Positive for myalgias, back pain and arthralgias. Negative for gait problem.  Skin: Negative for color change and rash.  Neurological: Positive for numbness. Negative for dizziness and headaches.  Hematological: Negative for adenopathy. Does not bruise/bleed easily.  Psychiatric/Behavioral: Negative for suicidal ideas, sleep disturbance and dysphoric mood. The patient is not  nervous/anxious.    BP 122/58  Pulse 80  Temp(Src) 97.8 F (36.6 C) (Oral)  Ht 5\' 1"  (1.549 m)  Wt 126 lb (57.153 kg)  BMI 23.81 kg/m2  SpO2 98%     Objective:   Physical Exam  Constitutional: She is oriented to person, place, and time. She appears well-developed and well-nourished. No distress.  HENT:  Head: Normocephalic and atraumatic.  Right Ear: External ear normal.  Left Ear: External ear normal.  Nose: Nose normal.  Mouth/Throat: Oropharynx is clear and moist. No oropharyngeal exudate.  Eyes: Conjunctivae are normal. Pupils are equal, round, and reactive to light. Right eye exhibits no discharge. Left eye exhibits no discharge. No scleral icterus.  Neck: Normal range of motion. Neck supple. No tracheal deviation present. No thyromegaly present.  Cardiovascular: Normal rate, regular rhythm, normal heart sounds and intact distal pulses.  Exam reveals no gallop and no friction rub.   No murmur heard. Pulmonary/Chest: Effort normal and breath sounds normal. No respiratory distress. She has no wheezes. She has no rales. She exhibits no tenderness.  Musculoskeletal: Normal range of motion. She exhibits no edema and no tenderness.  Lymphadenopathy:    She has no cervical adenopathy.  Neurological: She is alert and oriented to person, place, and time. No cranial nerve deficit or sensory deficit. She exhibits normal muscle tone. Coordination normal.  Skin: Skin is warm and dry. No rash noted. She is not diaphoretic. No erythema. No pallor.  Psychiatric: She has a normal mood and affect. Her behavior is normal. Judgment and thought content normal.          Assessment & Plan:

## 2011-05-28 NOTE — Assessment & Plan Note (Signed)
Patient is followed by hematology. She notes that she is scheduled for a bone marrow biopsy next week for further evaluation.

## 2011-06-01 ENCOUNTER — Ambulatory Visit (INDEPENDENT_AMBULATORY_CARE_PROVIDER_SITE_OTHER): Payer: Medicare Other | Admitting: Neurology

## 2011-06-01 ENCOUNTER — Other Ambulatory Visit (INDEPENDENT_AMBULATORY_CARE_PROVIDER_SITE_OTHER): Payer: Medicare Other

## 2011-06-01 ENCOUNTER — Encounter: Payer: Self-pay | Admitting: Neurology

## 2011-06-01 DIAGNOSIS — R935 Abnormal findings on diagnostic imaging of other abdominal regions, including retroperitoneum: Secondary | ICD-10-CM

## 2011-06-01 DIAGNOSIS — K254 Chronic or unspecified gastric ulcer with hemorrhage: Secondary | ICD-10-CM

## 2011-06-01 DIAGNOSIS — R209 Unspecified disturbances of skin sensation: Secondary | ICD-10-CM

## 2011-06-01 DIAGNOSIS — R42 Dizziness and giddiness: Secondary | ICD-10-CM

## 2011-06-01 DIAGNOSIS — D473 Essential (hemorrhagic) thrombocythemia: Secondary | ICD-10-CM

## 2011-06-01 DIAGNOSIS — R200 Anesthesia of skin: Secondary | ICD-10-CM

## 2011-06-01 DIAGNOSIS — I639 Cerebral infarction, unspecified: Secondary | ICD-10-CM

## 2011-06-01 DIAGNOSIS — I6350 Cerebral infarction due to unspecified occlusion or stenosis of unspecified cerebral artery: Secondary | ICD-10-CM

## 2011-06-01 DIAGNOSIS — G609 Hereditary and idiopathic neuropathy, unspecified: Secondary | ICD-10-CM

## 2011-06-01 LAB — SEDIMENTATION RATE: Sed Rate: 92 mm/hr — ABNORMAL HIGH (ref 0–22)

## 2011-06-01 NOTE — Progress Notes (Signed)
Dear Dr. Dan Humphreys,  Thank you for having me see Tanya Rice in consultation today at [facility] for her problem with vertigo and possible peripheral neuropathy.  As you may recall, she is a 78 y.o. year old female with a history of lumbar spondylosis with an L4-L5 radiculopathy, hypertension, hyperlipidemia, myelodysplastic syndrome who presents with a multiyear history of vertigo. She has also had the recent onset of bilateral numbness and tingling in her feet sparing her hands.  With respect to the vertigo she complains of the sudden onset of positional sensation of feeling off balance are being pulled backwards. She denies true "spinning". The spells and typically last for up to a day although the actual vertiginous feeling only lasts minutes and usually quiets down if she does not move. She denies tinnitus or hearing loss during the events. She denies any neighborhood symptoms. The spells of vertigo happen about once per month. She takes meclizine during the event but all this does sedate her. Apparently an MRI was done when her vertigo began approximately 4 years ago. Unfortunately we do not have the results of that study.  The tingling has only been occurring over the last month or 2. It is in the tops of the feet as well as makes her toes sore. She does get numbness in her right hand but this been going on for years and she thinks is unrelated. There is a question whether this numbness in both her feet is from her radiculopathy. She was placed on Neurontin 100/100/200 and she thinks this helps. She thinks the numbness and tingling has gotten worse since started.  She also complains of difficulty walking at all times. She feels unsteady on her feet particularly on uneven ground. She frequently gets pulled to one side.    Medical History:  Myelodysplastic syndrome manifesting as essential thrombocythemia on hydroxyurea. History of gastric ulcer. Chronic kidney disease stage III.  Hypertension hyperlipidemia.  May have had a TIA 3 years ago when she had intermittent difficulty using right hand.  Surgical History: Hernia repair and breast biopsy.  Social History: She does not smoke nor use alcohol.  Family History: No significant neurologic disease in the family.  Medications: Gabapentin 100/100/200, aspirin 81 mg, levothyroxine 25 mcg.  Allergies  Allergen Reactions  . Hydrocodone     Tremors  . Morphine And Related   . Augmentin Rash  . Soy Allergy Rash  . Sulfa Antibiotics Rash      Review of systems:  13 systems were reviewed and are notable for no neck pain.  Patient does have urinary urgency and is seeing a urologist for this..  All other review of systems are unremarkable.   Examination:  Filed Vitals:   06/01/11 1423  BP: 118/60  Pulse: 72  Weight: 127 lb (57.607 kg)     In general, well appearing older women.  Cardiovascular: The patient has a regular rate and rhythm and no carotid bruits.  Fundoscopy:  Disks are flat. Vessel caliber within normal limits.  Mental status:   The patient is oriented to person, place and time. Recent and remote memory are intact. Attention span and concentration are normal. Language including repetition, naming, following commands are intact. Fund of knowledge of current and historical events, as well as vocabulary are normal.  Cranial Nerves: Pupils are equally round and reactive to light. Visual fields full to confrontation. EOMs gaze evoked nystagmus and upbeat nystagmus on upgaze.  Facial sensation and muscles of mastication are intact. Muscles  of facial expression are symmetric. Hearing decreased bilateral finger rub. Tongue protrusion, uvula, palate midline.  Shoulder shrug intact  Motor:  The patient has normal bulk and tone, no pronator drift.  There are no adventitious movements.  5/5 muscle strength bilaterally except right hip flexor which is 4+.  Reflexes:  3+ throughout, + Hoffman's  bilaterally, toes down.   Coordination:  Normal finger to nose.  ?impaired H2S on left, ?difficulty with rebound testing on left.  Sensation is decreased in hands and feet to temperature and vibration.  Position sense normal.  Gait and Station are wide based and look stiff legged, particularly on left.    Romberg is positive.  Dix-Hallpike was negative.  Impression and Recommendations: 1.  Vertigo - I am concerned about the findings of upbeat nystagmus along with soft cerebellar signs on the left.  I have concern for a brainstem or cerebellar lesion that may be causing her vertigo.  Also basilar insufficiency is a possibility.  I am going to get an MRI brain, MRA head and neck to start.  She may also benefit from vestibular rehab, but I will hold on this for now.  She asked about medication for the vertigo as she is concerned the meclizine makes her too sleepy.  I suggested taking 1/2 tablet of the meclizine instead. 2.  Gait difficulty - the patient appears myelopathic on exam.  I am going to get an MRI of her C-spine to start.  This would be causing her walking problems.  I don't think given the report that her problems stem from her L-spine. 3.  ?Peripheral neuropathy - It is possible this is coming from her neck.  If the neck is normal I will get an EMG/NCS. I am going to get an SPEP given her MDS diagnosis.   We will see the patient back in 6 weeks.  Thank you for having [place] see Tanya Rice in consultation.  Feel free to contact me with any questions.  Lupita Raider Modesto Charon, MD [facility], [place] [address] [place], [place] [address] Phone: [phone] Fax: [phone].

## 2011-06-01 NOTE — Patient Instructions (Addendum)
Go to the basement to have your labs drawn today.  Your MRI of the C spine and brain is scheduled for Friday, March 1st at 12:00 noon.  Please arrive to first floor admitting at [facility] by 11:45am.  Your MRA head and neck is scheduled for Monday, March 4th at 12:00 noon.  Please arrive to first floor admitting at [facility] by 11:45am.  [phone].

## 2011-06-02 LAB — CBC CANCER CENTER
Basophil #: 0 x10 3/mm (ref 0.0–0.1)
Basophil %: 0.2 %
Eosinophil %: 2.5 %
Eosinophil: 1 %
HCT: 28.8 % — ABNORMAL LOW (ref 35.0–47.0)
HGB: 9.7 g/dL — ABNORMAL LOW (ref 12.0–16.0)
Lymphocyte %: 17.2 %
Lymphocytes: 22 %
Monocyte #: 0.7 x10 3/mm (ref 0.0–0.7)
Monocyte %: 9.3 %
Monocytes: 5 %
Neutrophil %: 70.8 %
Platelet: 809 x10 3/mm — ABNORMAL HIGH (ref 150–440)
RBC: 2.6 10*6/uL — ABNORMAL LOW (ref 3.80–5.20)
RDW: 16.5 % — ABNORMAL HIGH (ref 11.5–14.5)
Segmented Neutrophils: 72 %

## 2011-06-02 LAB — C-REACTIVE PROTEIN: CRP: 0.81 mg/dL — ABNORMAL HIGH (ref <0.60)

## 2011-06-03 ENCOUNTER — Ambulatory Visit: Payer: Self-pay | Admitting: Internal Medicine

## 2011-06-03 LAB — PROTEIN ELECTROPHORESIS, SERUM
Albumin ELP: 54 % — ABNORMAL LOW (ref 55.8–66.1)
Alpha-1-Globulin: 4.8 % (ref 2.9–4.9)
Beta 2: 5 % (ref 3.2–6.5)
Total Protein, Serum Electrophoresis: 7.2 g/dL (ref 6.0–8.3)

## 2011-06-04 ENCOUNTER — Inpatient Hospital Stay (HOSPITAL_COMMUNITY): Admission: RE | Admit: 2011-06-04 | Payer: Medicare Other | Source: Ambulatory Visit

## 2011-06-04 ENCOUNTER — Ambulatory Visit: Payer: Self-pay | Admitting: Oncology

## 2011-06-07 ENCOUNTER — Ambulatory Visit (HOSPITAL_COMMUNITY): Payer: Medicare Other

## 2011-06-07 ENCOUNTER — Ambulatory Visit (HOSPITAL_COMMUNITY)
Admission: RE | Admit: 2011-06-07 | Discharge: 2011-06-07 | Disposition: A | Discharge status: Home / Self Care | Payer: Medicare Other | Source: Ambulatory Visit | Attending: Neurology | Admitting: Neurology

## 2011-06-07 ENCOUNTER — Other Ambulatory Visit: Payer: Self-pay | Admitting: Neurology

## 2011-06-07 ENCOUNTER — Other Ambulatory Visit (HOSPITAL_COMMUNITY): Payer: Medicare Other

## 2011-06-07 DIAGNOSIS — R209 Unspecified disturbances of skin sensation: Secondary | ICD-10-CM | POA: Insufficient documentation

## 2011-06-07 DIAGNOSIS — N289 Disorder of kidney and ureter, unspecified: Secondary | ICD-10-CM | POA: Insufficient documentation

## 2011-06-07 DIAGNOSIS — I639 Cerebral infarction, unspecified: Secondary | ICD-10-CM

## 2011-06-07 DIAGNOSIS — G609 Hereditary and idiopathic neuropathy, unspecified: Secondary | ICD-10-CM

## 2011-06-07 DIAGNOSIS — R200 Anesthesia of skin: Secondary | ICD-10-CM

## 2011-06-07 DIAGNOSIS — D473 Essential (hemorrhagic) thrombocythemia: Secondary | ICD-10-CM

## 2011-06-07 DIAGNOSIS — R42 Dizziness and giddiness: Secondary | ICD-10-CM

## 2011-06-07 DIAGNOSIS — K254 Chronic or unspecified gastric ulcer with hemorrhage: Secondary | ICD-10-CM

## 2011-06-07 DIAGNOSIS — I10 Essential (primary) hypertension: Secondary | ICD-10-CM | POA: Insufficient documentation

## 2011-06-07 DIAGNOSIS — R935 Abnormal findings on diagnostic imaging of other abdominal regions, including retroperitoneum: Secondary | ICD-10-CM

## 2011-06-07 DIAGNOSIS — M4802 Spinal stenosis, cervical region: Secondary | ICD-10-CM | POA: Insufficient documentation

## 2011-06-07 DIAGNOSIS — G9389 Other specified disorders of brain: Secondary | ICD-10-CM | POA: Insufficient documentation

## 2011-06-07 DIAGNOSIS — I7789 Other specified disorders of arteries and arterioles: Secondary | ICD-10-CM | POA: Insufficient documentation

## 2011-06-08 ENCOUNTER — Other Ambulatory Visit (HOSPITAL_COMMUNITY): Payer: Medicare Other

## 2011-06-09 ENCOUNTER — Ambulatory Visit: Payer: Self-pay | Admitting: Oncology

## 2011-06-10 ENCOUNTER — Other Ambulatory Visit: Payer: Self-pay | Admitting: Neurology

## 2011-06-10 ENCOUNTER — Encounter: Payer: Self-pay | Admitting: Internal Medicine

## 2011-06-10 ENCOUNTER — Telehealth: Payer: Self-pay | Admitting: Neurology

## 2011-06-10 DIAGNOSIS — R42 Dizziness and giddiness: Secondary | ICD-10-CM

## 2011-06-10 DIAGNOSIS — R269 Unspecified abnormalities of gait and mobility: Secondary | ICD-10-CM

## 2011-06-10 DIAGNOSIS — G629 Polyneuropathy, unspecified: Secondary | ICD-10-CM

## 2011-06-10 NOTE — Telephone Encounter (Signed)
Message copied by Benay Spice on Thu Jun 10, 2011  4:14 PM ------      Message from: Milas Gain      Created: Wed Jun 09, 2011  9:56 PM       Let [name] know that her MRI's looked ok.  No cause of her dizziness or walking difficulties.              I would like to refer her to vestibular rehab as well as get an EMG/NCS ?peripheral neuropathy of her RUE and RLE.

## 2011-06-10 NOTE — Telephone Encounter (Signed)
Called to speak with the patient for the second time today. I had called her this morning to tell her the results of her MRI and to ask whether or not she wanted to proceed with the Vestibular Rehab and the NCV/EMG testing recommended by Dr. Modesto Charon and the patient agreed. I let her know that the NCV/EMG was scheduled for 03/18 at 11:00 at [facility] in [facility]. Their address and phone number given to the patient. I also let her know that I had made the referral to the Vestibular Rehab program at [facility] on [address] and she reports that she is currently seeing a PT at the hospital there in [place] and that the therapist there can do the Vestibular Rehab. It will be easier for her to get there than having to drive to [place]. I told her that was fine. The patient states she will call the [facility] location to cancel the appointment she had made earlier. No other questions or concerns at this time.

## 2011-06-14 ENCOUNTER — Encounter: Payer: Self-pay | Admitting: Internal Medicine

## 2011-06-16 ENCOUNTER — Encounter: Payer: Medicare Other | Admitting: Physical Therapy

## 2011-06-22 ENCOUNTER — Ambulatory Visit: Payer: Medicare Other | Admitting: Physical Therapy

## 2011-06-23 LAB — COMPREHENSIVE METABOLIC PANEL
Albumin: 3.7 g/dL (ref 3.4–5.0)
Anion Gap: 8 (ref 7–16)
BUN: 22 mg/dL — ABNORMAL HIGH (ref 7–18)
Bilirubin,Total: 0.3 mg/dL (ref 0.2–1.0)
Chloride: 101 mmol/L (ref 98–107)
Creatinine: 1.66 mg/dL — ABNORMAL HIGH (ref 0.60–1.30)
EGFR (African American): 38 — ABNORMAL LOW
EGFR (Non-African Amer.): 31 — ABNORMAL LOW
Glucose: 88 mg/dL (ref 65–99)
Potassium: 4.6 mmol/L (ref 3.5–5.1)
SGOT(AST): 32 U/L (ref 15–37)
Total Protein: 8.1 g/dL (ref 6.4–8.2)

## 2011-06-23 LAB — IRON AND TIBC
Iron Bind.Cap.(Total): 266 ug/dL (ref 250–450)
Iron Saturation: 18 %
Iron: 49 ug/dL — ABNORMAL LOW (ref 50–170)
Unbound Iron-Bind.Cap.: 217 ug/dL

## 2011-06-23 LAB — CBC CANCER CENTER
Basophil %: 0.2 %
Eosinophil #: 0.3 x10 3/mm (ref 0.0–0.7)
HGB: 10.1 g/dL — ABNORMAL LOW (ref 12.0–16.0)
Lymphocyte #: 1.7 x10 3/mm (ref 1.0–3.6)
MCH: 36.3 pg — ABNORMAL HIGH (ref 26.0–34.0)
MCHC: 33.4 g/dL (ref 32.0–36.0)
Monocyte #: 1.1 x10 3/mm — ABNORMAL HIGH (ref 0.0–0.7)
Monocyte %: 12.3 %
Neutrophil #: 6.1 x10 3/mm (ref 1.4–6.5)
Neutrophil %: 66 %
RBC: 2.77 10*6/uL — ABNORMAL LOW (ref 3.80–5.20)
WBC: 9.3 x10 3/mm (ref 3.6–11.0)

## 2011-06-24 ENCOUNTER — Encounter: Payer: Self-pay | Admitting: Internal Medicine

## 2011-06-24 ENCOUNTER — Ambulatory Visit (INDEPENDENT_AMBULATORY_CARE_PROVIDER_SITE_OTHER): Payer: Medicare Other | Admitting: Internal Medicine

## 2011-06-24 VITALS — BP 140/68 | HR 68 | Temp 98.0°F | Ht 61.0 in | Wt 125.0 lb

## 2011-06-24 DIAGNOSIS — R350 Frequency of micturition: Secondary | ICD-10-CM

## 2011-06-24 DIAGNOSIS — G589 Mononeuropathy, unspecified: Secondary | ICD-10-CM

## 2011-06-24 DIAGNOSIS — D473 Essential (hemorrhagic) thrombocythemia: Secondary | ICD-10-CM

## 2011-06-24 DIAGNOSIS — G629 Polyneuropathy, unspecified: Secondary | ICD-10-CM

## 2011-06-24 DIAGNOSIS — E538 Deficiency of other specified B group vitamins: Secondary | ICD-10-CM

## 2011-06-24 MED ORDER — CYANOCOBALAMIN 1000 MCG/ML IJ SOLN
1000.0000 ug | Freq: Once | INTRAMUSCULAR | Status: AC
  Administered 2011-06-24: 1000 ug via INTRAMUSCULAR

## 2011-06-24 NOTE — Assessment & Plan Note (Signed)
Patient is status post recent EMG testing. Will obtain results of evaluation. Patient also has followup with neurology. We'll continue Neurontin. Followup in 3 months.

## 2011-06-24 NOTE — Assessment & Plan Note (Signed)
Will give B12 shot today.

## 2011-06-24 NOTE — Assessment & Plan Note (Signed)
Patient recently underwent bone marrow biopsy. She was taken off hydroxyurea. Will obtain records from oncology. She will followup here in 3 months.

## 2011-06-24 NOTE — Progress Notes (Signed)
Subjective:    Patient ID: Tanya Rice, female    DOB: 11-14-1926, 78 y.o.   MRN: [id number]  HPI 78 year old female with history of gastric ulcer with hemorrhage, essential thrombocythemia, and neuropathy presents for followup. She reports that she was recently evaluated by neurology and was sent to have EMG testing on her legs. She is unsure of the results of this study. She notes that she also had MRI of the brain which was unremarkable. She reports that the symptoms of pain in her legs is fairly well-controlled with her current dose of Neurontin. She continues to have some intermittent swelling in her legs which seems to be worse in the afternoons. She has compression stockings but does not always wear these.  In regards to her history of essential thrombocythemia, she notes that her oncologist recently took her off hydroxyurea. He also performed a bone marrow biopsy. She was told that she had some changes associated with aging on her bone marrow biopsy. On recent lab work, she is still anemic with hemoglobin of 10 and platelet count was near 490,000. She is being followed every 2 weeks by her oncologist with repeat labs. She reports some improvement in her fatigue since coming off hydroxyurea.  Outpatient Encounter Prescriptions as of 06/24/2011  Medication Sig Dispense Refill  . Calcium Carbonate-Vitamin D (CALCIUM + D PO) Take by mouth daily.      . eszopiclone (LUNESTA) 2 MG TABS Take 2 mg by mouth at bedtime as needed and may repeat dose one time if needed. Take immediately before bedtime      . fexofenadine (ALLEGRA) 180 MG tablet Take 180 mg by mouth daily.      Marland Kitchen gabapentin (NEURONTIN) 100 MG capsule Take 1 tablet at breakfast and lunch and 2 tablets at bedtime.  120 capsule  3  . levothyroxine (SYNTHROID, LEVOTHROID) 25 MCG tablet Take 25 mcg by mouth daily.      . meclizine (ANTIVERT) 25 MG tablet Take 25 mg by mouth 3 (three) times daily as needed.      . metoprolol tartrate  (LOPRESSOR) 25 MG tablet Take 25 mg by mouth 2 (two) times daily.      . Mirabegron ER (MYRBETRIQ) 25 MG TB24 Take by mouth daily.      . Multiple Vitamins-Minerals (MULTIVITAMIN WITH MINERALS) tablet Take 1 tablet by mouth daily.      . pantoprazole (PROTONIX) 40 MG tablet Take 1 tablet (40 mg total) by mouth 2 (two) times daily.  180 tablet  1  . aspirin EC 81 MG tablet Take 81 mg by mouth daily. On hold (05/24/11)      . DISCONTD: hydroxyurea (HYDREA) 500 MG capsule Take 500 mg by mouth as directed. TAKE 2 on Monday, Wednesday & Friday, 1 all other days       Facility-Administered Encounter Medications as of 06/24/2011  Medication Dose Route Frequency Provider Last Rate Last Dose  . cyanocobalamin ((VITAMIN B-12)) injection 1,000 mcg  1,000 mcg Intramuscular Once Shelia Media, MD   1,000 mcg at 06/24/11 1055    Review of Systems  Constitutional: Positive for fatigue. Negative for fever, chills, appetite change and unexpected weight change.  HENT: Negative for ear pain, congestion, sore throat, trouble swallowing, neck pain, voice change and sinus pressure.   Eyes: Negative for visual disturbance.  Respiratory: Negative for cough, shortness of breath, wheezing and stridor.   Cardiovascular: Negative for chest pain, palpitations and leg swelling.  Gastrointestinal: Negative for nausea, vomiting, abdominal  pain, diarrhea, constipation, blood in stool, abdominal distention and anal bleeding.  Genitourinary: Negative for dysuria and flank pain.  Musculoskeletal: Positive for myalgias. Negative for arthralgias and gait problem.  Skin: Negative for color change and rash.  Neurological: Negative for dizziness and headaches.  Hematological: Negative for adenopathy. Does not bruise/bleed easily.  Psychiatric/Behavioral: Negative for suicidal ideas, sleep disturbance and dysphoric mood. The patient is not nervous/anxious.    BP 140/68  Pulse 68  Temp(Src) 98 F (36.7 C) (Oral)  Ht 5\' 1"   (1.549 m)  Wt 125 lb (56.7 kg)  BMI 23.62 kg/m2  SpO2 97%     Objective:   Physical Exam  Constitutional: She is oriented to person, place, and time. She appears well-developed and well-nourished. No distress.  HENT:  Head: Normocephalic and atraumatic.  Right Ear: External ear normal.  Left Ear: External ear normal.  Nose: Nose normal.  Mouth/Throat: Oropharynx is clear and moist. No oropharyngeal exudate.  Eyes: Conjunctivae are normal. Pupils are equal, round, and reactive to light. Right eye exhibits no discharge. Left eye exhibits no discharge. No scleral icterus.  Neck: Normal range of motion. Neck supple. No tracheal deviation present. No thyromegaly present.  Cardiovascular: Normal rate, regular rhythm, normal heart sounds and intact distal pulses.  Exam reveals no gallop and no friction rub.   No murmur heard. Pulmonary/Chest: Effort normal and breath sounds normal. No respiratory distress. She has no wheezes. She has no rales. She exhibits no tenderness.  Musculoskeletal: Normal range of motion. She exhibits edema (trace ankle). She exhibits no tenderness.  Lymphadenopathy:    She has no cervical adenopathy.  Neurological: She is alert and oriented to person, place, and time. No cranial nerve deficit. She exhibits normal muscle tone. Coordination normal.  Skin: Skin is warm and dry. No rash noted. She is not diaphoretic. No erythema. No pallor.  Psychiatric: She has a normal mood and affect. Her behavior is normal. Judgment and thought content normal.          Assessment & Plan:

## 2011-06-24 NOTE — Assessment & Plan Note (Signed)
Recently started on new medication by her urologist with some improvement. We'll continue to monitor.

## 2011-07-02 ENCOUNTER — Encounter: Payer: Self-pay | Admitting: Cardiovascular Disease

## 2011-07-05 ENCOUNTER — Ambulatory Visit: Payer: Self-pay | Admitting: Oncology

## 2011-07-05 ENCOUNTER — Ambulatory Visit (INDEPENDENT_AMBULATORY_CARE_PROVIDER_SITE_OTHER): Payer: Medicare Other | Admitting: Internal Medicine

## 2011-07-05 ENCOUNTER — Encounter: Payer: Self-pay | Admitting: Internal Medicine

## 2011-07-05 ENCOUNTER — Ambulatory Visit: Payer: Medicare Other

## 2011-07-05 ENCOUNTER — Telehealth: Payer: Self-pay | Admitting: *Deleted

## 2011-07-05 VITALS — BP 132/68 | HR 95 | Temp 98.1°F | Ht 61.0 in | Wt 123.0 lb

## 2011-07-05 DIAGNOSIS — D649 Anemia, unspecified: Secondary | ICD-10-CM

## 2011-07-05 DIAGNOSIS — N390 Urinary tract infection, site not specified: Secondary | ICD-10-CM

## 2011-07-05 DIAGNOSIS — D72829 Elevated white blood cell count, unspecified: Secondary | ICD-10-CM

## 2011-07-05 DIAGNOSIS — R5381 Other malaise: Secondary | ICD-10-CM

## 2011-07-05 LAB — CBC WITH DIFFERENTIAL/PLATELET
Basophils Absolute: 0 10*3/uL (ref 0.0–0.1)
Eosinophils Absolute: 0.4 10*3/uL (ref 0.0–0.7)
Lymphocytes Relative: 4.4 % — ABNORMAL LOW (ref 12.0–46.0)
MCHC: 32.4 g/dL (ref 30.0–36.0)
MCV: 104.7 fl — ABNORMAL HIGH (ref 78.0–100.0)
Monocytes Absolute: 3 10*3/uL — ABNORMAL HIGH (ref 0.1–1.0)
Neutrophils Relative %: 83 % — ABNORMAL HIGH (ref 43.0–77.0)
Platelets: 907 10*3/uL — ABNORMAL HIGH (ref 150.0–400.0)
RBC: 2.91 Mil/uL — ABNORMAL LOW (ref 3.87–5.11)

## 2011-07-05 LAB — COMPREHENSIVE METABOLIC PANEL
ALT: 231 U/L — ABNORMAL HIGH (ref 0–35)
AST: 124 U/L — ABNORMAL HIGH (ref 0–37)
Albumin: 3.3 g/dL — ABNORMAL LOW (ref 3.5–5.2)
Alkaline Phosphatase: 140 U/L — ABNORMAL HIGH (ref 39–117)
Calcium: 8.8 mg/dL (ref 8.4–10.5)
Chloride: 94 mEq/L — ABNORMAL LOW (ref 96–112)
Potassium: 3.9 mEq/L (ref 3.5–5.1)
Sodium: 128 mEq/L — ABNORMAL LOW (ref 135–145)
Total Protein: 7.5 g/dL (ref 6.0–8.3)

## 2011-07-05 LAB — POCT URINALYSIS DIPSTICK
Ketones, UA: NEGATIVE
Protein, UA: 30
pH, UA: 5.5

## 2011-07-05 MED ORDER — CIPROFLOXACIN HCL 250 MG PO TABS: 250.0000 mg | ORAL_TABLET | Freq: Two times a day (BID) | ORAL | Status: AC

## 2011-07-05 NOTE — Telephone Encounter (Signed)
CRITICAL LAB - WBC 27.2 Plts 907, they are sending out for path review. MD verbally informed.

## 2011-07-05 NOTE — Assessment & Plan Note (Signed)
Patient with malaise after recently being treated for urinary tract infection with Macrodantin. Based on her last kidney function, she is not a candidate for use of Macrodantin and question if she may be having toxicity from this medication. Will check CMP and CBC with labs today. Will stop Macrodantin. Will start her on Cipro. Will recent urinalysis and culture. Patient will followup in 2 days.

## 2011-07-05 NOTE — Assessment & Plan Note (Signed)
Will above, will send urinalysis and culture. As above, changing from Macrodantin to Cipro. Patient will followup in 3 days.

## 2011-07-05 NOTE — Progress Notes (Signed)
Subjective:    Patient ID: Tanya Rice, female    DOB: [birth year], 78 y.o.   MRN: [id number]  HPI 78 year old female presents for acute visit complaining of fatigue and general malaise. Notes that she was treated by her urologist last Wednesday for urinary tract infection with Macrodantin. Notes that she has had significant fatigue, malaise, and nausea since starting this medicine. She also notes some abdominal distention and abdominal pain. She denies any blood in her urine. She denies any fever or chills. She denies any flank pain. She has been having constipation. This is chronic for her.  Outpatient Encounter Prescriptions as of 07/05/2011  Medication Sig Dispense Refill  . aspirin EC 81 MG tablet Take 81 mg by mouth daily. On hold (05/24/11)      . Calcium Carbonate-Vitamin D (CALCIUM + D PO) Take by mouth daily.      . eszopiclone (LUNESTA) 2 MG TABS Take 2 mg by mouth at bedtime as needed and may repeat dose one time if needed. Take immediately before bedtime      . fexofenadine (ALLEGRA) 180 MG tablet Take 180 mg by mouth daily.      Marland Kitchen gabapentin (NEURONTIN) 100 MG capsule Take 1 tablet at breakfast and lunch and 2 tablets at bedtime.  120 capsule  3  . levothyroxine (SYNTHROID, LEVOTHROID) 25 MCG tablet Take 25 mcg by mouth daily.      . meclizine (ANTIVERT) 25 MG tablet Take 25 mg by mouth 3 (three) times daily as needed.      . metoprolol tartrate (LOPRESSOR) 25 MG tablet Take 25 mg by mouth 2 (two) times daily.      . Mirabegron ER (MYRBETRIQ) 25 MG TB24 Take by mouth daily.      . Multiple Vitamins-Minerals (MULTIVITAMIN WITH MINERALS) tablet Take 1 tablet by mouth daily.      . pantoprazole (PROTONIX) 40 MG tablet Take 1 tablet (40 mg total) by mouth 2 (two) times daily.  180 tablet  1  . DISCONTD: nitrofurantoin (MACRODANTIN) 100 MG capsule Take 100 mg by mouth 2 (two) times daily.      . ciprofloxacin (CIPRO) 250 MG tablet Take 1 tablet (250 mg total) by mouth 2 (two) times  daily.  6 tablet  0    Review of Systems  Constitutional: Positive for fatigue. Negative for fever, chills, appetite change and unexpected weight change.  HENT: Negative for ear pain, congestion, sore throat, trouble swallowing, neck pain, voice change and sinus pressure.   Eyes: Negative for visual disturbance.  Respiratory: Negative for cough, shortness of breath, wheezing and stridor.   Cardiovascular: Negative for chest pain, palpitations and leg swelling.  Gastrointestinal: Positive for abdominal pain and constipation. Negative for nausea, vomiting, diarrhea, blood in stool, abdominal distention and anal bleeding.  Genitourinary: Positive for dysuria and frequency. Negative for urgency, hematuria and flank pain.  Musculoskeletal: Negative for myalgias, arthralgias and gait problem.  Skin: Negative for color change and rash.  Neurological: Negative for dizziness and headaches.  Hematological: Negative for adenopathy. Does not bruise/bleed easily.  Psychiatric/Behavioral: Negative for suicidal ideas, sleep disturbance and dysphoric mood. The patient is not nervous/anxious.    BP 132/68  Pulse 95  Temp(Src) 98.1 F (36.7 C) (Oral)  Ht 5\' 1"  (1.549 m)  Wt 123 lb (55.792 kg)  BMI 23.24 kg/m2  SpO2 97%     Objective:   Physical Exam  Constitutional: She is oriented to person, place, and time. She appears well-developed and well-nourished. No  distress.  HENT:  Head: Normocephalic and atraumatic.  Right Ear: External ear normal.  Left Ear: External ear normal.  Nose: Nose normal.  Mouth/Throat: Oropharynx is clear and moist. No oropharyngeal exudate.  Eyes: Conjunctivae are normal. Pupils are equal, round, and reactive to light. Right eye exhibits no discharge. Left eye exhibits no discharge. No scleral icterus.  Neck: Normal range of motion. Neck supple. No tracheal deviation present. No thyromegaly present.  Cardiovascular: Normal rate, regular rhythm, normal heart sounds and  intact distal pulses.  Exam reveals no gallop and no friction rub.   No murmur heard. Pulmonary/Chest: Effort normal and breath sounds normal. No respiratory distress. She has no wheezes. She has no rales. She exhibits no tenderness.  Abdominal: Soft. Bowel sounds are normal. She exhibits distension (diffuse, mild). She exhibits no mass. There is tenderness. There is no rebound, no guarding and no CVA tenderness.  Musculoskeletal: Normal range of motion. She exhibits no edema and no tenderness.  Lymphadenopathy:    She has no cervical adenopathy.  Neurological: She is alert and oriented to person, place, and time. No cranial nerve deficit. She exhibits normal muscle tone. Coordination normal.  Skin: Skin is warm and dry. No rash noted. She is not diaphoretic. No erythema. No pallor.  Psychiatric: She has a normal mood and affect. Her behavior is normal. Judgment and thought content normal.          Assessment & Plan:

## 2011-07-06 ENCOUNTER — Other Ambulatory Visit (INDEPENDENT_AMBULATORY_CARE_PROVIDER_SITE_OTHER): Payer: Medicare Other | Admitting: *Deleted

## 2011-07-06 DIAGNOSIS — R748 Abnormal levels of other serum enzymes: Secondary | ICD-10-CM

## 2011-07-06 DIAGNOSIS — T148XXA Other injury of unspecified body region, initial encounter: Secondary | ICD-10-CM

## 2011-07-06 DIAGNOSIS — G629 Polyneuropathy, unspecified: Secondary | ICD-10-CM

## 2011-07-06 DIAGNOSIS — Z7982 Long term (current) use of aspirin: Secondary | ICD-10-CM

## 2011-07-06 DIAGNOSIS — Z79899 Other long term (current) drug therapy: Secondary | ICD-10-CM

## 2011-07-06 DIAGNOSIS — N390 Urinary tract infection, site not specified: Secondary | ICD-10-CM

## 2011-07-06 DIAGNOSIS — E119 Type 2 diabetes mellitus without complications: Secondary | ICD-10-CM

## 2011-07-06 DIAGNOSIS — Z7901 Long term (current) use of anticoagulants: Secondary | ICD-10-CM

## 2011-07-06 LAB — HEPATIC FUNCTION PANEL
ALT: 184 U/L — ABNORMAL HIGH (ref 0–35)
Alkaline Phosphatase: 129 U/L — ABNORMAL HIGH (ref 39–117)
Bilirubin, Direct: 0 mg/dL (ref 0.0–0.3)
Total Bilirubin: 0.2 mg/dL — ABNORMAL LOW (ref 0.3–1.2)

## 2011-07-06 LAB — PROTIME-INR: INR: 1.1 ratio — ABNORMAL HIGH (ref 0.8–1.0)

## 2011-07-06 NOTE — Progress Notes (Signed)
Addended by: Jobie Quaker on: 07/06/2011 12:05 PM   Modules accepted: Orders

## 2011-07-07 LAB — COMPREHENSIVE METABOLIC PANEL
Albumin: 2.8 g/dL — ABNORMAL LOW (ref 3.4–5.0)
Anion Gap: 13 (ref 7–16)
BUN: 19 mg/dL — ABNORMAL HIGH (ref 7–18)
Bilirubin,Total: 0.8 mg/dL (ref 0.2–1.0)
Chloride: 90 mmol/L — ABNORMAL LOW (ref 98–107)
Co2: 19 mmol/L — ABNORMAL LOW (ref 21–32)
Creatinine: 1.44 mg/dL — ABNORMAL HIGH (ref 0.60–1.30)
EGFR (African American): 45 — ABNORMAL LOW
EGFR (Non-African Amer.): 37 — ABNORMAL LOW
Glucose: 103 mg/dL — ABNORMAL HIGH (ref 65–99)
SGOT(AST): 79 U/L — ABNORMAL HIGH (ref 15–37)
SGPT (ALT): 157 U/L — ABNORMAL HIGH
Total Protein: 7.4 g/dL (ref 6.4–8.2)

## 2011-07-07 LAB — URINALYSIS, COMPLETE
Bacteria: NONE SEEN
Bilirubin,UR: NEGATIVE
Blood: NEGATIVE
Ketone: NEGATIVE
Leukocyte Esterase: NEGATIVE
Nitrite: NEGATIVE
Ph: 5 (ref 4.5–8.0)
Protein: NEGATIVE
RBC,UR: 1 /HPF (ref 0–5)
Specific Gravity: 1.005 (ref 1.003–1.030)
Squamous Epithelial: 1
WBC UR: 1 /HPF (ref 0–5)

## 2011-07-07 LAB — DIFFERENTIAL
Basophil #: 0.1 10*3/uL (ref 0.0–0.1)
Eosinophil #: 0.3 10*3/uL (ref 0.0–0.7)
Eosinophil %: 0.9 %
Lymphocyte %: 4.8 %
Lymphocytes: 11 %
Neutrophil #: 25 10*3/uL — ABNORMAL HIGH (ref 1.4–6.5)
Neutrophil %: 81.9 %

## 2011-07-07 LAB — CBC
HCT: 28 % — ABNORMAL LOW (ref 35.0–47.0)
MCH: 34 pg (ref 26.0–34.0)
MCHC: 32.3 g/dL (ref 32.0–36.0)
MCV: 105 fL — ABNORMAL HIGH (ref 80–100)
RBC: 2.66 10*6/uL — ABNORMAL LOW (ref 3.80–5.20)
RDW: 17.2 % — ABNORMAL HIGH (ref 11.5–14.5)
WBC: 30.5 10*3/uL — ABNORMAL HIGH (ref 3.6–11.0)

## 2011-07-07 LAB — CULTURE, URINE COMPREHENSIVE: Colony Count: NO GROWTH

## 2011-07-07 LAB — LIPASE, BLOOD: Lipase: 106 U/L (ref 73–393)

## 2011-07-07 NOTE — Progress Notes (Signed)
EMG/NCS - evidence of left L4 and S1 chronic radiculopathies.  No evidence of peripheral neuropathy.

## 2011-07-08 ENCOUNTER — Inpatient Hospital Stay: Payer: Self-pay | Admitting: Internal Medicine

## 2011-07-08 ENCOUNTER — Ambulatory Visit: Payer: Medicare Other | Admitting: Internal Medicine

## 2011-07-08 ENCOUNTER — Telehealth: Payer: Self-pay | Admitting: *Deleted

## 2011-07-08 LAB — BASIC METABOLIC PANEL
Calcium, Total: 8.1 mg/dL — ABNORMAL LOW (ref 8.5–10.1)
Co2: 21 mmol/L (ref 21–32)
EGFR (African American): 46 — ABNORMAL LOW
EGFR (Non-African Amer.): 38 — ABNORMAL LOW
Glucose: 99 mg/dL (ref 65–99)
Potassium: 3.9 mmol/L (ref 3.5–5.1)
Sodium: 130 mmol/L — ABNORMAL LOW (ref 136–145)

## 2011-07-08 LAB — TROPONIN I
Troponin-I: 0.07 ng/mL — ABNORMAL HIGH
Troponin-I: 0.06 ng/mL — ABNORMAL HIGH
Troponin-I: 0.07 ng/mL — ABNORMAL HIGH

## 2011-07-08 LAB — CK TOTAL AND CKMB (NOT AT ARMC)
CK, Total: 87 U/L (ref 21–215)
CK, Total: 233 U/L — ABNORMAL HIGH (ref 21–215)
CK, Total: 264 U/L — ABNORMAL HIGH (ref 21–215)
CK-MB: 5.3 ng/mL — ABNORMAL HIGH (ref 0.5–3.6)
CK-MB: 6.1 ng/mL — ABNORMAL HIGH (ref 0.5–3.6)

## 2011-07-08 LAB — URIC ACID: Uric Acid: 6.4 mg/dL — ABNORMAL HIGH (ref 2.6–6.0)

## 2011-07-08 LAB — SODIUM: Sodium: 128 mmol/L — ABNORMAL LOW (ref 136–145)

## 2011-07-08 LAB — TSH: Thyroid Stimulating Horm: 1.65 u[IU]/mL

## 2011-07-08 LAB — APTT: Activated PTT: 59.2 secs — ABNORMAL HIGH (ref 23.6–35.9)

## 2011-07-08 NOTE — Telephone Encounter (Signed)
Pt currently admitted to [facility]. 

## 2011-07-08 NOTE — Telephone Encounter (Signed)
[place] Fax: [phone] From: [facility] Date/ Time: 07/07/2011 8:03 PM Taken By: Jethro BolusDarl Pikes Facility: not collected Patient: Tanya Rice, Tanya Rice DOB: [birth year] Phone: [phone] Reason for Call: Attn Dr Dan Humphreys: Darl Pikes is a niece of the pt. Was told by family to call and let office know the pt has been taken to the [facility] tonight. Pt may need follow up appt. Regarding Appointment: Appt Date: Appt Time: Unknown Provider: Reason: Details: Outcome:

## 2011-07-08 NOTE — Telephone Encounter (Signed)
Can you follow up and see if she is okay?

## 2011-07-09 ENCOUNTER — Encounter: Payer: Self-pay | Admitting: Neurology

## 2011-07-09 LAB — COMPREHENSIVE METABOLIC PANEL
Alkaline Phosphatase: 97 U/L (ref 50–136)
Bilirubin,Total: 0.4 mg/dL (ref 0.2–1.0)
Chloride: 103 mmol/L (ref 98–107)
Creatinine: 1.37 mg/dL — ABNORMAL HIGH (ref 0.60–1.30)
EGFR (African American): 47 — ABNORMAL LOW
EGFR (Non-African Amer.): 39 — ABNORMAL LOW
Osmolality: 270 (ref 275–301)
Potassium: 3.8 mmol/L (ref 3.5–5.1)
SGOT(AST): 50 U/L — ABNORMAL HIGH (ref 15–37)
SGPT (ALT): 88 U/L — ABNORMAL HIGH
Sodium: 134 mmol/L — ABNORMAL LOW (ref 136–145)

## 2011-07-09 LAB — CBC WITH DIFFERENTIAL/PLATELET
HCT: 25.2 % — ABNORMAL LOW (ref 35.0–47.0)
Lymphocytes: 6 %
MCH: 33.7 pg (ref 26.0–34.0)
MCV: 105 fL — ABNORMAL HIGH (ref 80–100)
Metamyelocyte: 4 %
Platelet: 663 10*3/uL — ABNORMAL HIGH (ref 150–440)
Segmented Neutrophils: 66 %

## 2011-07-09 LAB — AFP TUMOR MARKER: AFP-Tumor Marker: 1.6 ng/mL (ref 0.0–8.3)

## 2011-07-09 LAB — APTT: Activated PTT: 123.3 secs — ABNORMAL HIGH (ref 23.6–35.9)

## 2011-07-09 LAB — PROTIME-INR: Prothrombin Time: 16.4 secs — ABNORMAL HIGH (ref 11.5–14.7)

## 2011-07-09 LAB — MAGNESIUM: Magnesium: 1.7 mg/dL — ABNORMAL LOW

## 2011-07-10 LAB — CBC WITH DIFFERENTIAL/PLATELET
Basophil %: 0.3 %
Eosinophil #: 0.5 10*3/uL (ref 0.0–0.7)
Eosinophil: 4 %
HCT: 23.9 % — ABNORMAL LOW (ref 35.0–47.0)
HGB: 7.7 g/dL — ABNORMAL LOW (ref 12.0–16.0)
Lymphocytes: 15 %
MCH: 33.7 pg (ref 26.0–34.0)
MCHC: 32 g/dL (ref 32.0–36.0)
MCV: 105 fL — ABNORMAL HIGH (ref 80–100)
Monocyte #: 2.2 10*3/uL — ABNORMAL HIGH (ref 0.0–0.7)
Monocytes: 5 %
Neutrophil #: 15 10*3/uL — ABNORMAL HIGH (ref 1.4–6.5)
Neutrophil %: 73.7 %
Platelet: 645 10*3/uL — ABNORMAL HIGH (ref 150–440)
RBC: 2.27 10*6/uL — ABNORMAL LOW (ref 3.80–5.20)
RDW: 17.5 % — ABNORMAL HIGH (ref 11.5–14.5)
Segmented Neutrophils: 76 %

## 2011-07-10 LAB — BASIC METABOLIC PANEL
Anion Gap: 11 (ref 7–16)
Chloride: 105 mmol/L (ref 98–107)
Co2: 19 mmol/L — ABNORMAL LOW (ref 21–32)
Creatinine: 1.41 mg/dL — ABNORMAL HIGH (ref 0.60–1.30)
EGFR (African American): 46 — ABNORMAL LOW
EGFR (Non-African Amer.): 38 — ABNORMAL LOW
Glucose: 89 mg/dL (ref 65–99)
Osmolality: 270 (ref 275–301)
Potassium: 3.6 mmol/L (ref 3.5–5.1)
Sodium: 135 mmol/L — ABNORMAL LOW (ref 136–145)

## 2011-07-10 LAB — PROTIME-INR
INR: 1.1
Prothrombin Time: 14.9 secs — ABNORMAL HIGH (ref 11.5–14.7)

## 2011-07-10 LAB — APTT: Activated PTT: 114.7 secs — ABNORMAL HIGH (ref 23.6–35.9)

## 2011-07-11 LAB — BASIC METABOLIC PANEL
Anion Gap: 13 (ref 7–16)
BUN: 13 mg/dL (ref 7–18)
Calcium, Total: 8.3 mg/dL — ABNORMAL LOW (ref 8.5–10.1)
Chloride: 109 mmol/L — ABNORMAL HIGH (ref 98–107)
Co2: 18 mmol/L — ABNORMAL LOW (ref 21–32)
EGFR (African American): 48 — ABNORMAL LOW
Glucose: 98 mg/dL (ref 65–99)
Potassium: 3.5 mmol/L (ref 3.5–5.1)
Sodium: 140 mmol/L (ref 136–145)

## 2011-07-11 LAB — CBC WITH DIFFERENTIAL/PLATELET
Basophil #: 0 10*3/uL (ref 0.0–0.1)
Eosinophil #: 0.5 10*3/uL (ref 0.0–0.7)
Eosinophil %: 2.7 %
HCT: 29.8 % — ABNORMAL LOW (ref 35.0–47.0)
Lymphocyte #: 2.4 10*3/uL (ref 1.0–3.6)
MCH: 33 pg (ref 26.0–34.0)
MCHC: 32.8 g/dL (ref 32.0–36.0)
Monocyte %: 10.3 %
Neutrophil #: 13.9 10*3/uL — ABNORMAL HIGH (ref 1.4–6.5)
RDW: 22 % — ABNORMAL HIGH (ref 11.5–14.5)

## 2011-07-11 LAB — PROTIME-INR
INR: 1.1
Prothrombin Time: 14.3 secs (ref 11.5–14.7)

## 2011-07-11 LAB — HEPATIC FUNCTION PANEL A (ARMC)
Albumin: 2.6 g/dL — ABNORMAL LOW (ref 3.4–5.0)
Bilirubin, Direct: 0.3 mg/dL — ABNORMAL HIGH (ref 0.00–0.20)
Bilirubin,Total: 1.3 mg/dL — ABNORMAL HIGH (ref 0.2–1.0)
SGOT(AST): 54 U/L — ABNORMAL HIGH (ref 15–37)

## 2011-07-11 LAB — APTT
Activated PTT: 53.2 secs — ABNORMAL HIGH (ref 23.6–35.9)
Activated PTT: 84.3 secs — ABNORMAL HIGH (ref 23.6–35.9)

## 2011-07-12 LAB — BASIC METABOLIC PANEL
Calcium, Total: 8.1 mg/dL — ABNORMAL LOW (ref 8.5–10.1)
Co2: 19 mmol/L — ABNORMAL LOW (ref 21–32)
Creatinine: 1.2 mg/dL (ref 0.60–1.30)
EGFR (African American): 55 — ABNORMAL LOW
Potassium: 3.2 mmol/L — ABNORMAL LOW (ref 3.5–5.1)
Sodium: 140 mmol/L (ref 136–145)

## 2011-07-12 LAB — CBC WITH DIFFERENTIAL/PLATELET
Basophil #: 0 10*3/uL (ref 0.0–0.1)
Basophil %: 0.2 %
Eosinophil #: 0.5 10*3/uL (ref 0.0–0.7)
Eosinophil %: 3.4 %
HCT: 26.4 % — ABNORMAL LOW (ref 35.0–47.0)
HGB: 8.6 g/dL — ABNORMAL LOW (ref 12.0–16.0)
Lymphocyte #: 2.3 10*3/uL (ref 1.0–3.6)
Lymphocyte %: 14.6 %
MCH: 33 pg (ref 26.0–34.0)
MCHC: 32.7 g/dL (ref 32.0–36.0)
Monocyte #: 1.9 10*3/uL — ABNORMAL HIGH (ref 0.0–0.7)
Monocyte %: 11.9 %
Neutrophil #: 10.9 10*3/uL — ABNORMAL HIGH (ref 1.4–6.5)
Neutrophil %: 69.9 %
Platelet: 592 10*3/uL — ABNORMAL HIGH (ref 150–440)
RBC: 2.61 10*6/uL — ABNORMAL LOW (ref 3.80–5.20)
WBC: 15.7 10*3/uL — ABNORMAL HIGH (ref 3.6–11.0)

## 2011-07-12 LAB — HEPATIC FUNCTION PANEL A (ARMC)
Alkaline Phosphatase: 90 U/L (ref 50–136)
Bilirubin, Direct: 0.1 mg/dL (ref 0.00–0.20)
Bilirubin,Total: 0.4 mg/dL (ref 0.2–1.0)
SGOT(AST): 42 U/L — ABNORMAL HIGH (ref 15–37)
Total Protein: 5.8 g/dL — ABNORMAL LOW (ref 6.4–8.2)

## 2011-07-12 LAB — APTT: Activated PTT: 100.4 secs — ABNORMAL HIGH (ref 23.6–35.9)

## 2011-07-12 LAB — PROTIME-INR: Prothrombin Time: 15.7 secs — ABNORMAL HIGH (ref 11.5–14.7)

## 2011-07-13 LAB — COMPREHENSIVE METABOLIC PANEL
Albumin: 2.1 g/dL — ABNORMAL LOW (ref 3.4–5.0)
Alkaline Phosphatase: 86 U/L (ref 50–136)
Anion Gap: 10 (ref 7–16)
BUN: 11 mg/dL (ref 7–18)
Bilirubin,Total: 0.3 mg/dL (ref 0.2–1.0)
Calcium, Total: 8 mg/dL — ABNORMAL LOW (ref 8.5–10.1)
Chloride: 109 mmol/L — ABNORMAL HIGH (ref 98–107)
Co2: 19 mmol/L — ABNORMAL LOW (ref 21–32)
Creatinine: 1.16 mg/dL (ref 0.60–1.30)
EGFR (African American): 57 — ABNORMAL LOW
EGFR (Non-African Amer.): 47 — ABNORMAL LOW
Glucose: 92 mg/dL (ref 65–99)
Osmolality: 275 (ref 275–301)
Potassium: 3.9 mmol/L (ref 3.5–5.1)
SGOT(AST): 45 U/L — ABNORMAL HIGH (ref 15–37)
SGPT (ALT): 46 U/L
Sodium: 138 mmol/L (ref 136–145)
Total Protein: 5.7 g/dL — ABNORMAL LOW (ref 6.4–8.2)

## 2011-07-13 LAB — PROTIME-INR
INR: 1.6
Prothrombin Time: 19.8 secs — ABNORMAL HIGH (ref 11.5–14.7)

## 2011-07-13 LAB — CBC WITH DIFFERENTIAL/PLATELET
Basophil #: 0 10*3/uL (ref 0.0–0.1)
Basophil %: 0.1 %
Eosinophil %: 3 %
HCT: 25.6 % — ABNORMAL LOW (ref 35.0–47.0)
HGB: 8.3 g/dL — ABNORMAL LOW (ref 12.0–16.0)
MCHC: 32.4 g/dL (ref 32.0–36.0)
MCV: 100 fL (ref 80–100)
Monocyte #: 1.2 10*3/uL — ABNORMAL HIGH (ref 0.0–0.7)
Monocyte %: 8.4 %
Neutrophil %: 73.4 %
Platelet: 572 10*3/uL — ABNORMAL HIGH (ref 150–440)
RDW: 22.5 % — ABNORMAL HIGH (ref 11.5–14.5)
WBC: 13.9 10*3/uL — ABNORMAL HIGH (ref 3.6–11.0)

## 2011-07-13 LAB — URINE CULTURE

## 2011-07-13 LAB — APTT: Activated PTT: 110.1 secs — ABNORMAL HIGH (ref 23.6–35.9)

## 2011-07-14 ENCOUNTER — Ambulatory Visit: Payer: Medicare Other | Admitting: Neurology

## 2011-07-14 LAB — COMPREHENSIVE METABOLIC PANEL
Albumin: 2.2 g/dL — ABNORMAL LOW (ref 3.4–5.0)
Alkaline Phosphatase: 86 U/L (ref 50–136)
Anion Gap: 11 (ref 7–16)
Bilirubin,Total: 0.2 mg/dL (ref 0.2–1.0)
Calcium, Total: 8.2 mg/dL — ABNORMAL LOW (ref 8.5–10.1)
Creatinine: 1.26 mg/dL (ref 0.60–1.30)
EGFR (African American): 52 — ABNORMAL LOW
EGFR (Non-African Amer.): 43 — ABNORMAL LOW
Glucose: 75 mg/dL (ref 65–99)
Osmolality: 276 (ref 275–301)
SGOT(AST): 39 U/L — ABNORMAL HIGH (ref 15–37)
Sodium: 139 mmol/L (ref 136–145)
Total Protein: 5.7 g/dL — ABNORMAL LOW (ref 6.4–8.2)

## 2011-07-14 LAB — CBC WITH DIFFERENTIAL/PLATELET
Basophil #: 0 10*3/uL (ref 0.0–0.1)
Basophil %: 0.4 %
Eosinophil #: 0.3 10*3/uL (ref 0.0–0.7)
Eosinophil %: 2.5 %
HCT: 25.7 % — ABNORMAL LOW (ref 35.0–47.0)
HGB: 8.4 g/dL — ABNORMAL LOW (ref 12.0–16.0)
Lymphocyte #: 2.4 10*3/uL (ref 1.0–3.6)
Lymphocyte %: 19.6 %
MCHC: 32.5 g/dL (ref 32.0–36.0)
MCV: 101 fL — ABNORMAL HIGH (ref 80–100)
Monocyte #: 1.2 x10 3/mm — ABNORMAL HIGH (ref 0.2–0.9)
Monocyte %: 10 %
Neutrophil %: 67.5 %
Platelet: 498 10*3/uL — ABNORMAL HIGH (ref 150–440)
RBC: 2.55 10*6/uL — ABNORMAL LOW (ref 3.80–5.20)

## 2011-07-14 LAB — PROTIME-INR
INR: 3.3
Prothrombin Time: 33.5 secs — ABNORMAL HIGH (ref 11.5–14.7)

## 2011-07-15 ENCOUNTER — Ambulatory Visit: Payer: Self-pay | Admitting: Oncology

## 2011-07-15 ENCOUNTER — Encounter: Payer: Self-pay | Admitting: Internal Medicine

## 2011-07-15 ENCOUNTER — Telehealth: Payer: Self-pay | Admitting: *Deleted

## 2011-07-15 LAB — CBC CANCER CENTER
Basophil %: 0.5 %
HCT: 31.1 % — ABNORMAL LOW (ref 35.0–47.0)
HGB: 10.4 g/dL — ABNORMAL LOW (ref 12.0–16.0)
Lymphocyte #: 1.3 x10 3/mm (ref 1.0–3.6)
Lymphocyte %: 13.1 %
MCH: 33.3 pg (ref 26.0–34.0)
MCHC: 33.3 g/dL (ref 32.0–36.0)
Monocyte #: 0.6 x10 3/mm (ref 0.2–0.9)
Neutrophil #: 7.9 x10 3/mm — ABNORMAL HIGH (ref 1.4–6.5)
Neutrophil %: 78.6 %
Platelet: 585 x10 3/mm — ABNORMAL HIGH (ref 150–440)
RDW: 21.8 % — ABNORMAL HIGH (ref 11.5–14.5)
WBC: 10 x10 3/mm (ref 3.6–11.0)

## 2011-07-15 LAB — COMPREHENSIVE METABOLIC PANEL
Albumin: 2.6 g/dL — ABNORMAL LOW (ref 3.4–5.0)
BUN: 14 mg/dL (ref 7–18)
Chloride: 104 mmol/L (ref 98–107)
Co2: 24 mmol/L (ref 21–32)
Creatinine: 1.31 mg/dL — ABNORMAL HIGH (ref 0.60–1.30)
EGFR (African American): 44 — ABNORMAL LOW
Glucose: 109 mg/dL — ABNORMAL HIGH (ref 65–99)
Osmolality: 277 (ref 275–301)
Potassium: 4.1 mmol/L (ref 3.5–5.1)
SGOT(AST): 42 U/L — ABNORMAL HIGH (ref 15–37)
SGPT (ALT): 50 U/L

## 2011-07-15 NOTE — Telephone Encounter (Signed)
I called and spoke with patient's niece, she says that patient was discharged last night from [facility]. She was seen by Dr. Doylene Canning her cancer dr today and they checked a pt/inr there. Niece received call this afternoon with results of pt/inr and she does not know the result, but she was told that patient is to come off of the coumadin per Dr. Koleen Nimrod. Niece also said that patient is doing well other than some swelling, but she was prescribed lasix for that. I have called and requested the labs from today at the cancer center, I also sent request for notes from [facility]. I sent Ermalinda Barrios a note regarding the labs that are not being routed to you.

## 2011-07-15 NOTE — Telephone Encounter (Signed)
Hey Dr. Dan Humphreys, the pt/inr from the 2nd looks like another one that was never routed to you. Please advise.

## 2011-07-15 NOTE — Telephone Encounter (Signed)
Patient's daughter is requesting home health for her mother.

## 2011-07-15 NOTE — Telephone Encounter (Signed)
INR was low 1.1.  We need to figure out why labs are being routed. I never saw this.  However, I thought she was admitted to the hospital.  She was found to have portal vein thrombus.  They would have rechecked INR.  Did they adjust medications?

## 2011-07-16 NOTE — Telephone Encounter (Signed)
Fine to set up Home Health. I would recommend [facility].

## 2011-07-19 ENCOUNTER — Ambulatory Visit: Payer: Medicare Other | Admitting: Neurology

## 2011-07-19 LAB — PROTIME-INR: INR: 2.3

## 2011-07-20 NOTE — Telephone Encounter (Signed)
Home health referral pending completion from MD

## 2011-07-21 ENCOUNTER — Ambulatory Visit: Payer: Medicare Other | Admitting: Neurology

## 2011-07-22 ENCOUNTER — Telehealth: Payer: Self-pay | Admitting: *Deleted

## 2011-07-22 ENCOUNTER — Telehealth: Payer: Self-pay | Admitting: Internal Medicine

## 2011-07-22 NOTE — Telephone Encounter (Signed)
We can put her in for office visit next week.  We need to call Lawernce Pitts to find out what the delay is for home health.

## 2011-07-22 NOTE — Telephone Encounter (Signed)
Caller: Becky/Child; PCP: Ronna Polio; CB#: [phone]; ; ; Call regarding Cough/Congestion;   Daughter calling. States patient was hospitalized at [facility]  from 07/07/11-07/14/11 and dx with Myeloprolipherative Disorder, Leukocytosis and Portal Vein Thrombosis.  States Home Health was to see patient and they have not yet had a home visit. States patient was seen for f/u by Hematologist 07/19/11. States she was advised to have post hospital discharge appt. in one week. States patient continues to have cough. States expectorating clear sputum. Afebrile. No wheezing. Denies shortness of breath. Denies chest pain. During call, Daughter states that Home Health Nurse is scheduled to come 07/22/11.  Triage per Cough Protocol. No emergent sx identified. Care advice given per guidelines. Call back parameters reviewed. Daughter verbalizes understanding. RN spoke with Maxine Glenn in office. States will discuss with Dr. Dan Humphreys and return call to Daughter @ [phone]. Daughter/Becky informed of above, verbalizes understanding.

## 2011-07-22 NOTE — Telephone Encounter (Signed)
We should call Lawernce Pitts at [facility] in regards to this patient.

## 2011-07-22 NOTE — Telephone Encounter (Signed)
Tanya Rice with [facility] called stating that patients daughter Tanya Rice) stated that patient was discharged on 07/14/2011 from the hospital, home health has not shown up to her home, patient was suppose to have a post hospital f/u in one week which would have been 07/21/2011.  Tanya Rice was told that Dr. Dan Humphreys doesn't have anything until May.  Should patient be seen sooner?  Ok to wait?  Please advise.

## 2011-07-23 NOTE — Telephone Encounter (Signed)
Called and spoke with Kriste Basque, she says that the Home Health did finally come out yesterday afternoon. Also patient has an appt scheduled for next week.

## 2011-07-23 NOTE — Telephone Encounter (Signed)
Thanks

## 2011-07-23 NOTE — Telephone Encounter (Signed)
[facility] and was advised that Sherrilyn Rist is on vacation, spoke with [facility] and was advised that patient was not in their system.  Paper work was faxed to [place] at [phone] and she stated that if any further information is needed she will call back.

## 2011-07-26 ENCOUNTER — Telehealth: Payer: Self-pay | Admitting: Internal Medicine

## 2011-07-26 ENCOUNTER — Other Ambulatory Visit: Payer: Self-pay | Admitting: *Deleted

## 2011-07-26 ENCOUNTER — Ambulatory Visit (INDEPENDENT_AMBULATORY_CARE_PROVIDER_SITE_OTHER): Payer: Medicare Other | Admitting: Internal Medicine

## 2011-07-26 ENCOUNTER — Encounter: Payer: Self-pay | Admitting: Internal Medicine

## 2011-07-26 VITALS — BP 105/66 | HR 72 | Temp 97.9°F | Ht 61.0 in | Wt 114.0 lb

## 2011-07-26 DIAGNOSIS — E538 Deficiency of other specified B group vitamins: Secondary | ICD-10-CM

## 2011-07-26 DIAGNOSIS — N390 Urinary tract infection, site not specified: Secondary | ICD-10-CM

## 2011-07-26 DIAGNOSIS — IMO0001 Reserved for inherently not codable concepts without codable children: Secondary | ICD-10-CM | POA: Insufficient documentation

## 2011-07-26 DIAGNOSIS — Z7901 Long term (current) use of anticoagulants: Secondary | ICD-10-CM

## 2011-07-26 DIAGNOSIS — Q068 Other specified congenital malformations of spinal cord: Secondary | ICD-10-CM

## 2011-07-26 DIAGNOSIS — I81 Portal vein thrombosis: Secondary | ICD-10-CM | POA: Insufficient documentation

## 2011-07-26 LAB — CBC WITH DIFFERENTIAL/PLATELET
Basophils Absolute: 0.3 10*3/uL — ABNORMAL HIGH (ref 0.0–0.1)
Basophils Relative: 3 % — ABNORMAL HIGH (ref 0–1)
Eosinophils Relative: 10 % — ABNORMAL HIGH (ref 0–5)
HCT: 30.4 % — ABNORMAL LOW (ref 36.0–46.0)
MCH: 32.1 pg (ref 26.0–34.0)
MCHC: 33.6 g/dL (ref 30.0–36.0)
MCV: 95.6 fL (ref 78.0–100.0)
Monocytes Absolute: 1.1 10*3/uL — ABNORMAL HIGH (ref 0.1–1.0)
RDW: 19 % — ABNORMAL HIGH (ref 11.5–15.5)

## 2011-07-26 LAB — COMPREHENSIVE METABOLIC PANEL
ALT: 13 U/L (ref 0–35)
AST: 27 U/L (ref 0–37)
Chloride: 95 mEq/L — ABNORMAL LOW (ref 96–112)
Creat: 1.58 mg/dL — ABNORMAL HIGH (ref 0.50–1.10)
Total Bilirubin: 0.6 mg/dL (ref 0.3–1.2)

## 2011-07-26 LAB — PROTIME-INR
INR: 1.57 — ABNORMAL HIGH (ref <1.50)
Prothrombin Time: 19.3 seconds — ABNORMAL HIGH (ref 11.6–15.2)

## 2011-07-26 LAB — POCT URINALYSIS DIPSTICK
Bilirubin, UA: NEGATIVE
Glucose, UA: NEGATIVE
Nitrite, UA: POSITIVE
Spec Grav, UA: 1.01
Urobilinogen, UA: 0.2

## 2011-07-26 MED ORDER — CYANOCOBALAMIN 1000 MCG/ML IJ SOLN
1000.0000 ug | Freq: Once | INTRAMUSCULAR | Status: AC
  Administered 2011-07-26: 1000 ug via INTRAMUSCULAR

## 2011-07-26 MED ORDER — CIPROFLOXACIN HCL 250 MG PO TABS: 250.0000 mg | ORAL_TABLET | Freq: Two times a day (BID) | ORAL | Status: DC

## 2011-07-26 NOTE — Assessment & Plan Note (Signed)
Patient was diagnosed with portal vein thrombosis during recent hospitalization. She is back on Coumadin for anticoagulation. This is extremely high risk for her given her history of gastric ulcer and hemorrhage. Will check INR today. She has followup with hematology later this week.

## 2011-07-26 NOTE — Progress Notes (Signed)
Subjective:    Patient ID: Tanya Rice, female    DOB: 1926/10/11, 78 y.o.   MRN: [id number]  HPI 78 year old female with history of myelodysplastic disorder presents for followup after recent hospitalization. She was admitted with abdominal pain and elevated liver function tests. She was found to have portal vein thrombosis. She was discharged home 2 weeks ago with a plan for home health care at home, however home health was not able to see her for 2 weeks. Her daughter reports being extremely stressed about this. Her daughter was able to find a bedside commode and other equipment for use in the home to help care for her mother. She reports her mother has been incredibly frail. She has been coughing frequently. She has not had fever. Her cough is nonproductive. She denies shortness of breath. She initially had extensive lower extremity swelling during her hospitalization, but this has resolved with the use of Lasix. She denies any chest pain or palpitations. She denies any abdominal pain. She has been on Coumadin but is unsure of her INR level.  Outpatient Encounter Prescriptions as of 07/26/2011  Medication Sig Dispense Refill  . eszopiclone (LUNESTA) 2 MG TABS Take 2 mg by mouth at bedtime as needed and may repeat dose one time if needed. Take immediately before bedtime      . fexofenadine (ALLEGRA) 180 MG tablet Take 180 mg by mouth daily.      . furosemide (LASIX) 20 MG tablet Take 1 tablet by mouth Daily.      . hydroxyurea (HYDREA) 500 MG capsule as directed.      Marland Kitchen levothyroxine (SYNTHROID, LEVOTHROID) 25 MCG tablet Take 25 mcg by mouth daily.      . meclizine (ANTIVERT) 25 MG tablet Take 25 mg by mouth 3 (three) times daily as needed.      . metoprolol tartrate (LOPRESSOR) 25 MG tablet Take 25 mg by mouth 2 (two) times daily.      . pantoprazole (PROTONIX) 40 MG tablet Take 1 tablet (40 mg total) by mouth 2 (two) times daily.  180 tablet  1  . spironolactone (ALDACTONE) 50 MG tablet  Take 1 tablet by mouth Daily.      Marland Kitchen warfarin (COUMADIN) 2.5 MG tablet Take 2.5 mg by mouth every Monday, Wednesday, and Friday.      Marland Kitchen aspirin EC 81 MG tablet Take 81 mg by mouth daily. On hold (05/24/11)      . Calcium Carbonate-Vitamin D (CALCIUM + D PO) Take by mouth daily.      Marland Kitchen gabapentin (NEURONTIN) 100 MG capsule Take 1 tablet at breakfast and lunch and 2 tablets at bedtime.  120 capsule  3  . Multiple Vitamins-Minerals (MULTIVITAMIN WITH MINERALS) tablet Take 1 tablet by mouth daily.      Marland Kitchen DISCONTD: Mirabegron ER (MYRBETRIQ) 25 MG TB24 Take by mouth daily.       Facility-Administered Encounter Medications as of 07/26/2011  Medication Dose Route Frequency Provider Last Rate Last Dose  . cyanocobalamin ((VITAMIN B-12)) injection 1,000 mcg  1,000 mcg Intramuscular Once Shelia Media, MD   1,000 mcg at 07/26/11 1201    Review of Systems  Constitutional: Positive for fatigue. Negative for fever, chills, appetite change and unexpected weight change.  HENT: Negative for ear pain, congestion, sore throat, trouble swallowing, neck pain, voice change and sinus pressure.   Eyes: Negative for visual disturbance.  Respiratory: Positive for shortness of breath. Negative for cough, wheezing and stridor.   Cardiovascular: Positive  for leg swelling. Negative for chest pain and palpitations.  Gastrointestinal: Negative for nausea, vomiting, abdominal pain, diarrhea, constipation, blood in stool, abdominal distention and anal bleeding.  Genitourinary: Negative for dysuria and flank pain.  Musculoskeletal: Negative for myalgias, arthralgias and gait problem.  Skin: Negative for color change and rash.  Neurological: Negative for dizziness and headaches.  Hematological: Negative for adenopathy. Does not bruise/bleed easily.  Psychiatric/Behavioral: Negative for suicidal ideas, sleep disturbance and dysphoric mood. The patient is not nervous/anxious.    BP 105/66  Pulse 72  Temp(Src) 97.9 F (36.6  C) (Oral)  Ht 5\' 1"  (1.549 m)  Wt 114 lb (51.71 kg)  BMI 21.54 kg/m2  SpO2 97%     Objective:   Physical Exam  Constitutional: She is oriented to person, place, and time. She appears well-developed and well-nourished. She has a sickly appearance. No distress.  HENT:  Head: Normocephalic and atraumatic.  Right Ear: External ear normal.  Left Ear: External ear normal.  Nose: Nose normal.  Mouth/Throat: Oropharynx is clear and moist. No oropharyngeal exudate.  Eyes: Conjunctivae are normal. Pupils are equal, round, and reactive to light. Right eye exhibits no discharge. Left eye exhibits no discharge. No scleral icterus.  Neck: Normal range of motion. Neck supple. No tracheal deviation present. No thyromegaly present.  Cardiovascular: Normal rate, regular rhythm, normal heart sounds and intact distal pulses.  Exam reveals no gallop and no friction rub.   No murmur heard. Pulmonary/Chest: Effort normal and breath sounds normal. No respiratory distress. She has no wheezes. She has no rales. She exhibits no tenderness.  Musculoskeletal: Normal range of motion. She exhibits no edema and no tenderness.  Lymphadenopathy:    She has no cervical adenopathy.  Neurological: She is alert and oriented to person, place, and time. No cranial nerve deficit. She exhibits normal muscle tone. Coordination normal.  Skin: Skin is warm and dry. No rash noted. She is not diaphoretic. No erythema. No pallor.  Psychiatric: She has a normal mood and affect. Her behavior is normal. Judgment and thought content normal.          Assessment & Plan:

## 2011-07-26 NOTE — Assessment & Plan Note (Signed)
Diagnosed on recent hospitalization. Patient was placed back on hydroxyurea by her oncologist. Given the presence of portal vein thrombosis, she was started back on Coumadin however this is extremely high risk for her given her history of gastric hemorrhage. Will recheck INR today. Will request notes from her oncologist. She has followup with oncology later this week.

## 2011-07-26 NOTE — Assessment & Plan Note (Signed)
Patient with general malaise and urinalysis consistent with urinary tract infection. Will send urine for culture. Will treat with Cipro. Patient was cautioned on the use of Cipro and Coumadin at the same time and the potential risk for elevated INR while on Cipro. She will followup in one week.

## 2011-07-26 NOTE — Telephone Encounter (Signed)
Zella Ball calling to report labs done 07/26/11 as STAT, PT-19.3, INR 1.57. No priors to report. Spoke with pts daughter Kriste Basque and pt takes Coumadin 2.5 mg MWF. CBC and CMP faxed to office. Spoke with Dr. Artist Pais and advised to add Coumadin on Sundays as well and f/u in 2-3 days, daughter notified and message sent to Dr. Artist Pais per request in [facility]

## 2011-07-27 ENCOUNTER — Ambulatory Visit: Payer: Medicare Other

## 2011-07-27 NOTE — Progress Notes (Signed)
Addended by: Ronna Polio A on: 07/27/2011 09:22 AM   Modules accepted: Orders

## 2011-07-27 NOTE — Telephone Encounter (Signed)
Order has been faxed

## 2011-07-28 ENCOUNTER — Other Ambulatory Visit: Payer: Self-pay | Admitting: Internal Medicine

## 2011-07-28 MED ORDER — ESZOPICLONE 2 MG PO TABS: 2.0000 mg | ORAL_TABLET | Freq: Every evening | ORAL | Status: DC | PRN

## 2011-07-28 NOTE — Telephone Encounter (Signed)
Yes daughter we did. Daughter was notified yesterday.

## 2011-07-28 NOTE — Telephone Encounter (Signed)
Copy of note to Dr. Dan Humphreys

## 2011-07-28 NOTE — Telephone Encounter (Signed)
I thought we received her labs yesterday, and increased coumadin to 2.5mg  daily?

## 2011-07-29 LAB — CBC CANCER CENTER
Basophil #: 0.1 x10 3/mm (ref 0.0–0.1)
Basophil %: 1.7 %
Eosinophil %: 4.1 %
HGB: 10.1 g/dL — ABNORMAL LOW (ref 12.0–16.0)
Lymphocyte #: 1.6 x10 3/mm (ref 1.0–3.6)
Lymphocyte %: 31.8 %
MCH: 32.5 pg (ref 26.0–34.0)
MCV: 99 fL (ref 80–100)
Monocyte #: 1.3 x10 3/mm — ABNORMAL HIGH (ref 0.2–0.9)
Monocyte %: 27 %
Neutrophil #: 1.7 x10 3/mm (ref 1.4–6.5)
Neutrophil %: 35.4 %
RDW: 20.4 % — ABNORMAL HIGH (ref 11.5–14.5)

## 2011-07-29 LAB — PROTIME-INR
INR: 1.5
Prothrombin Time: 18.2 secs — ABNORMAL HIGH (ref 11.5–14.7)

## 2011-07-29 LAB — URINE CULTURE

## 2011-08-02 ENCOUNTER — Encounter: Payer: Self-pay | Admitting: Internal Medicine

## 2011-08-02 ENCOUNTER — Ambulatory Visit (INDEPENDENT_AMBULATORY_CARE_PROVIDER_SITE_OTHER): Payer: Medicare Other | Admitting: Internal Medicine

## 2011-08-02 VITALS — BP 110/78 | HR 82 | Temp 97.7°F | Resp 14 | Wt 115.5 lb

## 2011-08-02 DIAGNOSIS — R059 Cough, unspecified: Secondary | ICD-10-CM | POA: Insufficient documentation

## 2011-08-02 DIAGNOSIS — J302 Other seasonal allergic rhinitis: Secondary | ICD-10-CM

## 2011-08-02 DIAGNOSIS — Z7901 Long term (current) use of anticoagulants: Secondary | ICD-10-CM | POA: Insufficient documentation

## 2011-08-02 DIAGNOSIS — R300 Dysuria: Secondary | ICD-10-CM

## 2011-08-02 DIAGNOSIS — J309 Allergic rhinitis, unspecified: Secondary | ICD-10-CM

## 2011-08-02 DIAGNOSIS — G4700 Insomnia, unspecified: Secondary | ICD-10-CM | POA: Insufficient documentation

## 2011-08-02 DIAGNOSIS — Q068 Other specified congenital malformations of spinal cord: Secondary | ICD-10-CM

## 2011-08-02 DIAGNOSIS — R05 Cough: Secondary | ICD-10-CM | POA: Insufficient documentation

## 2011-08-02 LAB — POCT URINALYSIS DIPSTICK
Bilirubin, UA: NEGATIVE
Blood, UA: NEGATIVE
Glucose, UA: NEGATIVE
Ketones, UA: NEGATIVE
Spec Grav, UA: 1.015
Urobilinogen, UA: 0.2

## 2011-08-02 MED ORDER — ESZOPICLONE 3 MG PO TABS: 3.0000 mg | ORAL_TABLET | Freq: Every evening | ORAL | Status: DC | PRN

## 2011-08-02 MED ORDER — ESZOPICLONE 3 MG PO TABS: 3.0000 mg | ORAL_TABLET | Freq: Every day | ORAL | Status: DC

## 2011-08-02 MED ORDER — CIPROFLOXACIN HCL 250 MG PO TABS: 250.0000 mg | ORAL_TABLET | Freq: Two times a day (BID) | ORAL | Status: AC

## 2011-08-02 MED ORDER — FEXOFENADINE HCL 180 MG PO TABS: 180.0000 mg | ORAL_TABLET | Freq: Every day | ORAL | Status: DC

## 2011-08-02 NOTE — Assessment & Plan Note (Signed)
Patient was recently seen by hematologist. Will get notes from evaluation. We'll continue hydroxyurea.

## 2011-08-02 NOTE — Assessment & Plan Note (Signed)
Unclear etiology. Exam is normal today. Patient recently had chest x-ray performed by her hematologist. Will try to get records on this. Question of medications, including hydroxyurea might be contributed to cough. Alternatively, patient has history of seasonal allergies which may be contracting. Will try to restart Allegra to see if any improvement. If no improvement, or heart appears enlarged on chest x-ray, will get echo.

## 2011-08-02 NOTE — Assessment & Plan Note (Signed)
As above, will restart Allegra.

## 2011-08-02 NOTE — Progress Notes (Signed)
Subjective:    Patient ID: Tanya Rice, female    DOB: 01/27/1927, 78 y.o.   MRN: [id number]  HPI 78 year old female with history of myelodysplasia, portal vein thrombosis on chronic Coumadin, insomnia, and dysuria presents for followup. Patient reports she is feeling slightly better than her last visit, however still complains of poor appetite and general fatigue. She also continues to complain of chronic dry cough. The cough seems to be worsened when she is lying down. She questions whether he would be safe to restart her Allegra as this seemed to help in the past. She notes her hematologist recently performed a chest x-ray but she is unsure of the results of this. She has not had any fever or chills.  In regards to her chronic anticoagulation, her dose of Coumadin was increased last week because of the low INR. She is scheduled to repeat level today.  In regards to her insomnia, she reports no improvement with 2 mg of Lunesta. Her daughter sometimes gives her an extra half pill with some improvement in her symptoms. Her daughter notes that she frequently sleeps during the day and then is awake at night.  She also complains today of persistent dysuria. Her last urine culture was positive for Escherichia coli. This was treated with Cipro. She reports no improvement in dysuria with this. She has not had fever, chills, flank pain.  Outpatient Encounter Prescriptions as of 08/02/2011  Medication Sig Dispense Refill  . aspirin EC 81 MG tablet Take 81 mg by mouth daily. On hold (05/24/11)      . benzonatate (TESSALON) 100 MG capsule Take 100 mg by mouth 3 (three) times daily as needed.      . Calcium Carbonate-Vitamin D (CALCIUM + D PO) Take by mouth daily.      . Eszopiclone 3 MG TABS Take 1 tablet (3 mg total) by mouth at bedtime.  30 tablet  3  . hydroxyurea (HYDREA) 500 MG capsule as directed. Take [2] Qam & [1] QHs      . lactose free nutrition (BOOST) LIQD Take 1 Container by mouth daily.       Marland Kitchen levothyroxine (SYNTHROID, LEVOTHROID) 25 MCG tablet Take 25 mcg by mouth daily.      . meclizine (ANTIVERT) 25 MG tablet Take 25 mg by mouth 3 (three) times daily as needed.      . metoprolol tartrate (LOPRESSOR) 25 MG tablet Take 25 mg by mouth 2 (two) times daily.      . Multiple Vitamins-Minerals (MULTIVITAMIN WITH MINERALS) tablet Take 1 tablet by mouth daily.      . pantoprazole (PROTONIX) 40 MG tablet Take 1 tablet (40 mg total) by mouth 2 (two) times daily.  180 tablet  1  . DISCONTD: eszopiclone (LUNESTA) 2 MG TABS Take 1 tablet (2 mg total) by mouth at bedtime as needed and may repeat dose one time if needed. Take immediately before bedtime  30 tablet  3  . DISCONTD: eszopiclone 3 MG TABS Take 1 tablet (3 mg total) by mouth at bedtime as needed and may repeat dose one time if needed. Take immediately before bedtime  30 tablet  3  . ciprofloxacin (CIPRO) 250 MG tablet Take 1 tablet (250 mg total) by mouth 2 (two) times daily.  14 tablet  0  . fexofenadine (ALLEGRA) 180 MG tablet Take 1 tablet (180 mg total) by mouth daily.  30 tablet  6  . furosemide (LASIX) 20 MG tablet Take 1 tablet by mouth Daily.      Marland Kitchen  gabapentin (NEURONTIN) 100 MG capsule Take 1 tablet at breakfast and lunch and 2 tablets at bedtime.  120 capsule  3  . spironolactone (ALDACTONE) 50 MG tablet Take 1 tablet by mouth Daily.      Marland Kitchen warfarin (COUMADIN) 2.5 MG tablet Take 2.5 mg by mouth every Monday, Wednesday, and Friday.      Marland Kitchen DISCONTD: ciprofloxacin (CIPRO) 250 MG tablet Take 1 tablet (250 mg total) by mouth 2 (two) times daily.  14 tablet  0  . DISCONTD: fexofenadine (ALLEGRA) 180 MG tablet Take 180 mg by mouth daily.       BP 110/78  Pulse 82  Temp(Src) 97.7 F (36.5 C) (Oral)  Resp 14  Wt 115 lb 8 oz (52.39 kg)  Review of Systems  Constitutional: Positive for fatigue. Negative for fever, chills, appetite change and unexpected weight change.  HENT: Negative for ear pain, congestion, sore throat, trouble  swallowing, neck pain, voice change and sinus pressure.   Eyes: Negative for visual disturbance.  Respiratory: Positive for cough. Negative for shortness of breath, wheezing and stridor.   Cardiovascular: Negative for chest pain, palpitations and leg swelling.  Gastrointestinal: Negative for nausea, vomiting, abdominal pain, diarrhea, constipation, blood in stool, abdominal distention and anal bleeding.  Genitourinary: Positive for dysuria. Negative for urgency, hematuria, flank pain, vaginal pain and pelvic pain.  Musculoskeletal: Negative for myalgias, arthralgias and gait problem.  Skin: Negative for color change and rash.  Neurological: Negative for dizziness and headaches.  Hematological: Negative for adenopathy. Does not bruise/bleed easily.  Psychiatric/Behavioral: Negative for suicidal ideas, sleep disturbance and dysphoric mood. The patient is not nervous/anxious.        Objective:   Physical Exam  Constitutional: She is oriented to person, place, and time. She appears well-developed and well-nourished. No distress.  HENT:  Head: Normocephalic and atraumatic.  Right Ear: External ear normal.  Left Ear: External ear normal.  Nose: Nose normal.  Mouth/Throat: Oropharynx is clear and moist. No oropharyngeal exudate.  Eyes: Conjunctivae are normal. Pupils are equal, round, and reactive to light. Right eye exhibits no discharge. Left eye exhibits no discharge. No scleral icterus.  Neck: Normal range of motion. Neck supple. No tracheal deviation present. No thyromegaly present.  Cardiovascular: Normal rate, regular rhythm, normal heart sounds and intact distal pulses.  Exam reveals no gallop and no friction rub.   No murmur heard. Pulmonary/Chest: Effort normal and breath sounds normal. No respiratory distress. She has no wheezes. She has no rales. She exhibits no tenderness.  Musculoskeletal: Normal range of motion. She exhibits no edema and no tenderness.  Lymphadenopathy:    She  has no cervical adenopathy.  Neurological: She is alert and oriented to person, place, and time. No cranial nerve deficit. She exhibits normal muscle tone. Coordination normal.  Skin: Skin is warm and dry. No rash noted. She is not diaphoretic. No erythema. No pallor.  Psychiatric: She has a normal mood and affect. Her behavior is normal. Judgment and thought content normal.          Assessment & Plan:

## 2011-08-02 NOTE — Assessment & Plan Note (Signed)
Will increase dose of Lunesta to 3 mg daily to see if any improvement with insomnia.

## 2011-08-02 NOTE — Assessment & Plan Note (Signed)
Will recheck INR today. Goal INR is 2-3.

## 2011-08-02 NOTE — Assessment & Plan Note (Signed)
Patient complains of dysuria. Urinalysis is negative today, however will send urine for culture. Question if her dysuria may be secondary to hydroxyurea. Will treat empirically with Cipro until urine cultures back.

## 2011-08-03 ENCOUNTER — Other Ambulatory Visit: Payer: Self-pay | Admitting: *Deleted

## 2011-08-03 ENCOUNTER — Telehealth: Payer: Self-pay | Admitting: *Deleted

## 2011-08-03 LAB — CBC WITH DIFFERENTIAL/PLATELET
Basophils Relative: 15.6 % — ABNORMAL HIGH (ref 0.0–3.0)
Eosinophils Absolute: 0.1 10*3/uL (ref 0.0–0.7)
Eosinophils Relative: 1.7 % (ref 0.0–5.0)
Hemoglobin: 10 g/dL — ABNORMAL LOW (ref 12.0–15.0)
MCHC: 32.6 g/dL (ref 30.0–36.0)
MCV: 100 fl (ref 78.0–100.0)
Monocytes Absolute: 0.5 10*3/uL (ref 0.1–1.0)
Neutro Abs: 2.5 10*3/uL (ref 1.4–7.7)
Neutrophils Relative %: 48.9 % (ref 43.0–77.0)
RBC: 3.07 Mil/uL — ABNORMAL LOW (ref 3.87–5.11)
WBC: 5.1 10*3/uL (ref 4.5–10.5)

## 2011-08-03 LAB — COMPREHENSIVE METABOLIC PANEL
Albumin: 3.7 g/dL (ref 3.5–5.2)
Alkaline Phosphatase: 67 U/L (ref 39–117)
BUN: 29 mg/dL — ABNORMAL HIGH (ref 6–23)
Calcium: 9.2 mg/dL (ref 8.4–10.5)
Chloride: 95 mEq/L — ABNORMAL LOW (ref 96–112)
Creatinine, Ser: 1.5 mg/dL — ABNORMAL HIGH (ref 0.4–1.2)
Glucose, Bld: 94 mg/dL (ref 70–99)
Potassium: 5.1 mEq/L (ref 3.5–5.1)

## 2011-08-03 LAB — PROTIME-INR
INR: 1.49 (ref <1.50)
Prothrombin Time: 18.6 seconds — ABNORMAL HIGH (ref 11.6–15.2)

## 2011-08-03 MED ORDER — WARFARIN SODIUM 3 MG PO TABS: 3.0000 mg | ORAL_TABLET | Freq: Every day | ORAL | Status: DC

## 2011-08-03 NOTE — Telephone Encounter (Signed)
Scheduled lab visit Mon 05.06.13 for INR/PT per JAW/SLS

## 2011-08-03 NOTE — Telephone Encounter (Signed)
Message copied by Regis Bill on Tue Aug 03, 2011  9:31 AM ------      Message from: Ronna Polio A      Created: Tue Aug 03, 2011  7:49 AM       INR was low. Can we have her increase to 3mg  of coumadin daily and then repeat INR 1 week.

## 2011-08-04 ENCOUNTER — Ambulatory Visit: Payer: Self-pay | Admitting: Oncology

## 2011-08-04 LAB — URINE CULTURE: Colony Count: NO GROWTH

## 2011-08-09 ENCOUNTER — Encounter (HOSPITAL_COMMUNITY): Payer: Self-pay | Admitting: *Deleted

## 2011-08-09 ENCOUNTER — Telehealth: Payer: Self-pay | Admitting: Internal Medicine

## 2011-08-09 ENCOUNTER — Emergency Department (HOSPITAL_COMMUNITY): Payer: Medicare Other

## 2011-08-09 ENCOUNTER — Ambulatory Visit (INDEPENDENT_AMBULATORY_CARE_PROVIDER_SITE_OTHER): Payer: Medicare Other | Admitting: *Deleted

## 2011-08-09 ENCOUNTER — Encounter (HOSPITAL_COMMUNITY): Payer: Self-pay

## 2011-08-09 ENCOUNTER — Emergency Department (INDEPENDENT_AMBULATORY_CARE_PROVIDER_SITE_OTHER)
Admission: EM | Admit: 2011-08-09 | Discharge: 2011-08-09 | Disposition: A | Discharge status: Nursing Home (Includes ICF) | Payer: Medicare Other | Source: Home / Self Care

## 2011-08-09 ENCOUNTER — Emergency Department (HOSPITAL_COMMUNITY)
Admission: EM | Admit: 2011-08-09 | Discharge: 2011-08-10 | Disposition: A | Discharge status: Home / Self Care | Payer: Medicare Other | Attending: Emergency Medicine | Admitting: Emergency Medicine

## 2011-08-09 DIAGNOSIS — H9190 Unspecified hearing loss, unspecified ear: Secondary | ICD-10-CM

## 2011-08-09 DIAGNOSIS — R42 Dizziness and giddiness: Secondary | ICD-10-CM | POA: Insufficient documentation

## 2011-08-09 DIAGNOSIS — I129 Hypertensive chronic kidney disease with stage 1 through stage 4 chronic kidney disease, or unspecified chronic kidney disease: Secondary | ICD-10-CM | POA: Insufficient documentation

## 2011-08-09 DIAGNOSIS — Z7982 Long term (current) use of aspirin: Secondary | ICD-10-CM | POA: Insufficient documentation

## 2011-08-09 DIAGNOSIS — Z7901 Long term (current) use of anticoagulants: Secondary | ICD-10-CM | POA: Insufficient documentation

## 2011-08-09 DIAGNOSIS — N189 Chronic kidney disease, unspecified: Secondary | ICD-10-CM | POA: Insufficient documentation

## 2011-08-09 DIAGNOSIS — D6859 Other primary thrombophilia: Secondary | ICD-10-CM | POA: Insufficient documentation

## 2011-08-09 LAB — DIFFERENTIAL
Basophils Relative: 1 % (ref 0–1)
Eosinophils Relative: 2 % (ref 0–5)
Monocytes Absolute: 0.4 10*3/uL (ref 0.1–1.0)
Neutrophils Relative %: 33 % — ABNORMAL LOW (ref 43–77)

## 2011-08-09 LAB — BASIC METABOLIC PANEL
CO2: 23 mEq/L (ref 19–32)
Chloride: 98 mEq/L (ref 96–112)
Creatinine, Ser: 1.23 mg/dL — ABNORMAL HIGH (ref 0.50–1.10)

## 2011-08-09 LAB — CBC
Hemoglobin: 9.3 g/dL — ABNORMAL LOW (ref 12.0–15.0)
MCH: 34.1 pg — ABNORMAL HIGH (ref 26.0–34.0)
MCV: 100.4 fL — ABNORMAL HIGH (ref 78.0–100.0)
RBC: 2.73 MIL/uL — ABNORMAL LOW (ref 3.87–5.11)

## 2011-08-09 LAB — HEPATIC FUNCTION PANEL
AST: 46 U/L — ABNORMAL HIGH (ref 0–37)
Alkaline Phosphatase: 59 U/L (ref 39–117)
Bilirubin, Direct: 0.1 mg/dL (ref 0.0–0.3)
Total Bilirubin: 0.5 mg/dL (ref 0.3–1.2)

## 2011-08-09 LAB — PROTIME-INR: Prothrombin Time: 15.4 s — ABNORMAL HIGH (ref 10.2–12.4)

## 2011-08-09 NOTE — ED Provider Notes (Signed)
Tanya Rice is a 78 y.o. female who presents to Urgent Care today for acute hearing loss and vertigo. Patient developed acute vertigo today at noon and right hearing loss at 2 PM.  She take Dramamine which helped with the vertigo. Her primary care doctor advised that she presented to urgent care. She denies any other weakness or numbness but notes chronic difficulty walking secondary to her long hospitalization.  She normally walks with a walker at baseline which she does not have it now.  She has a hypercoagulable state secondary to myeloproliferative disorder. She is currently on warfarin and her INR is 1.4.  She denies any fevers or chills or current tinnitus.   PMH reviewed. Significant for myeloproliferative disorder and hypercoagulable state ROS as above. Medications reviewed. No current facility-administered medications for this encounter.   Current Outpatient Prescriptions  Medication Sig Dispense Refill  . aspirin EC 81 MG tablet Take 81 mg by mouth daily. On hold (05/24/11)      . benzonatate (TESSALON) 100 MG capsule Take 100 mg by mouth 3 (three) times daily as needed.      . Calcium Carbonate-Vitamin D (CALCIUM + D PO) Take by mouth daily.      . ciprofloxacin (CIPRO) 250 MG tablet Take 1 tablet (250 mg total) by mouth 2 (two) times daily.  14 tablet  0  . Eszopiclone 3 MG TABS Take 1 tablet (3 mg total) by mouth at bedtime.  30 tablet  3  . fexofenadine (ALLEGRA) 180 MG tablet Take 1 tablet (180 mg total) by mouth daily.  30 tablet  6  . furosemide (LASIX) 20 MG tablet Take 1 tablet by mouth Daily.      Marland Kitchen gabapentin (NEURONTIN) 100 MG capsule Take 1 tablet at breakfast and lunch and 2 tablets at bedtime.  120 capsule  3  . hydroxyurea (HYDREA) 500 MG capsule as directed. Take [2] Qam & [1] QHs      . lactose free nutrition (BOOST) LIQD Take 1 Container by mouth daily.      Marland Kitchen levothyroxine (SYNTHROID, LEVOTHROID) 25 MCG tablet Take 25 mcg by mouth daily.      . meclizine  (ANTIVERT) 25 MG tablet Take 25 mg by mouth 3 (three) times daily as needed.      . metoprolol tartrate (LOPRESSOR) 25 MG tablet Take 25 mg by mouth 2 (two) times daily.      . Multiple Vitamins-Minerals (MULTIVITAMIN WITH MINERALS) tablet Take 1 tablet by mouth daily.      . pantoprazole (PROTONIX) 40 MG tablet Take 1 tablet (40 mg total) by mouth 2 (two) times daily.  180 tablet  1  . spironolactone (ALDACTONE) 50 MG tablet Take 1 tablet by mouth Daily.      Marland Kitchen warfarin (COUMADIN) 2.5 MG tablet Take 2.5 mg by mouth every Monday, Wednesday, and Friday.      . warfarin (COUMADIN) 3 MG tablet Take 1 tablet (3 mg total) by mouth daily. Increase to 3 mg daily for [1] week, recheck INR/PT.  7 tablet  0    Exam:  BP 130/70  Pulse 66  Temp(Src) 98.4 F (36.9 C) (Oral)  Resp 18  SpO2 98% Gen: Well NAD HEENT: EOMI,  MMM no nystagmus noted on seated resting exam. Right ear canal is clear and the tympanic membrane is normal appearing. Left ear canal is partially occluded with cerumen the tympanic membrane is white and translucent.  She has decreased hearing on the right.  Results for orders placed in visit on 08/09/11 (from the past 24 hour(s))  PROTIME-INR     Status: Abnormal   Collection Time   08/09/11  1:31 PM      Component Value Range   Prothrombin Time 15.4 (*) 10.2 - 12.4 (sec)   INR 1.4 (*) 0.8 - 1.0 (ratio)   No results found.  Assessment and Plan: 78 y.o. female with acute unilateral hearing loss and vertigo.  Patient has a hypercoagulable state and symptoms that could be characteristic of a stroke. She is outside of stroke treatment window and does not display symptoms of serious intercerebral bleed such as headache etc.  I do think that stroke is to be considered and evaluated further in the emergency room.  Plan to transfer patient to the emergency room via shuttle.  Family and patient both expressed understanding.     Rodolph Bong, MD 08/09/11 [phone]

## 2011-08-09 NOTE — ED Notes (Signed)
PT. TRANSFERRED FROM [facility] , REPORTS PROGRESSING HEARING LOSS TODAY BUT CAN HEAR WHEN USING HEARING AID.  DENIES OTHER COMPLAINTS.

## 2011-08-09 NOTE — ED Provider Notes (Signed)
Tanya Rice is a 78 y.o. female who presents to Urgent Care today for acute hearing loss and vertigo. Patient developed acute vertigo today at noon and right hearing loss at 2 PM.  She take Dramamine which helped with the vertigo. Her primary care doctor advised that she presented to urgent care. She denies any other weakness or numbness but notes chronic difficulty walking secondary to her long hospitalization.  She normally walks with a walker at baseline which she does not have it now.  She has a hypercoagulable state secondary to myeloproliferative disorder. She is currently on warfarin and her INR is 1.4.  She denies any fevers or chills or current tinnitus.   PMH reviewed. Significant for myeloproliferative disorder and hypercoagulable state ROS as above. Medications reviewed. No current facility-administered medications for this encounter.   Current Outpatient Prescriptions  Medication Sig Dispense Refill  . aspirin EC 81 MG tablet Take 81 mg by mouth daily. On hold (05/24/11)      . benzonatate (TESSALON) 100 MG capsule Take 100 mg by mouth 3 (three) times daily as needed. For cough      . Calcium Carbonate-Vitamin D (CALCIUM + D PO) Take 1 tablet by mouth daily.       . ciprofloxacin (CIPRO) 250 MG tablet Take 1 tablet (250 mg total) by mouth 2 (two) times daily.  14 tablet  0  . Eszopiclone 3 MG TABS Take 1 tablet (3 mg total) by mouth at bedtime.  30 tablet  3  . fexofenadine (ALLEGRA) 180 MG tablet Take 1 tablet (180 mg total) by mouth daily.  30 tablet  6  . hydroxyurea (HYDREA) 500 MG capsule Take 500-1,000 mg by mouth 2 (two) times daily. Takes 2 caps in the am, 1 cap in the pm      . lactose free nutrition (BOOST) LIQD Take 1 Container by mouth daily.      Marland Kitchen levothyroxine (SYNTHROID, LEVOTHROID) 25 MCG tablet Take 25 mcg by mouth daily.      . meclizine (ANTIVERT) 25 MG tablet Take 25 mg by mouth 3 (three) times daily as needed. For vertigo      . metoprolol tartrate  (LOPRESSOR) 25 MG tablet Take 25 mg by mouth daily.       . Multiple Vitamins-Minerals (MULTIVITAMIN WITH MINERALS) tablet Take 1 tablet by mouth daily.      . pantoprazole (PROTONIX) 40 MG tablet Take 1 tablet (40 mg total) by mouth 2 (two) times daily.  180 tablet  1  . warfarin (COUMADIN) 2.5 MG tablet Take 2.5 mg by mouth every Monday, Wednesday, and Friday.      . warfarin (COUMADIN) 3 MG tablet Take 3 mg by mouth every Tuesday, Thursday, Saturday, and Sunday. Increase to 3 mg daily for [1] week, recheck INR/PT.      . DISCONTD: warfarin (COUMADIN) 3 MG tablet Take 1 tablet (3 mg total) by mouth daily. Increase to 3 mg daily for [1] week, recheck INR/PT.  7 tablet  0    Exam:  BP 140/58  Pulse 71  Temp(Src) 98.2 F (36.8 C) (Oral)  Resp 18  SpO2 96% Gen: Well NAD HEENT: EOMI,  MMM no nystagmus noted on seated resting exam. Right ear canal is clear and the tympanic membrane is normal appearing. Left ear canal is partially occluded with cerumen the tympanic membrane is white and translucent.  She has decreased hearing on the right.    Results for orders placed in visit on 08/09/11 (  from the past 24 hour(s))  PROTIME-INR     Status: Abnormal   Collection Time   08/09/11  1:31 PM      Component Value Range   Prothrombin Time 15.4 (*) 10.2 - 12.4 (sec)   INR 1.4 (*) 0.8 - 1.0 (ratio)   No results found.  Assessment and Plan: 78 y.o. female with acute unilateral hearing loss and vertigo.  Patient has a hypercoagulable state and symptoms that could be characteristic of a stroke. She is outside of stroke treatment window and does not display symptoms of serious intercerebral bleed such as headache etc.  I do think that stroke is to be considered and evaluated further in the emergency room.  Plan to transfer patient to the emergency room via shuttle.  Family and patient both expressed understanding.     Rodolph Bong, MD 08/09/11 [phone]

## 2011-08-09 NOTE — ED Notes (Signed)
Family here w patient; concerned about hearing loss. Had taken hearing aide out to charge them , and thought it may be they had misfunctioned, but has also started to have dizziness, NAD at present

## 2011-08-09 NOTE — Telephone Encounter (Signed)
Caller: Becky/Child; PCP: Ronna Polio; CB#: [phone]; Call regarding Dizziness; Hx vertigo. Daughter is calling as pt began having dizzy spells with hearing loss at 1300 today. Dramamine 2 pills at 1500 w/o relief. ED now disposition per Dizziness Protocol-new onset of decreased hearing. RN called and spoke to Paulina who spoke to Dr. Dan Humphreys who advises to have pt seen today. Daughter is informed. Will take pt to [facility] now.

## 2011-08-10 ENCOUNTER — Telehealth: Payer: Self-pay | Admitting: Internal Medicine

## 2011-08-10 LAB — PATHOLOGIST SMEAR REVIEW

## 2011-08-10 MED ORDER — MECLIZINE HCL 25 MG PO TABS: 25.0000 mg | ORAL_TABLET | Freq: Three times a day (TID) | ORAL | Status: DC | PRN

## 2011-08-10 NOTE — ED Provider Notes (Signed)
History     CSN: [id number]  Arrival date & time 08/09/11  1900   First MD Initiated Contact with Patient 08/09/11 2110      Chief Complaint  Patient presents with  . Hearing Problem    (Consider location/radiation/quality/duration/timing/severity/associated sxs/prior treatment) HPI  78yoF h/o myeloproliferative d/o, chronic peripheral vertigo on meclizine, possible TI, CKI pw vertigo. Family the patient had acute onset vertigo at noon today. She states it lasted for about 2 hours, from noon until 2PM. Was worse with head movement. It resolved after meclizine. She states that was typical for her vertigo for which she had been worked up by neurology in the past. She took the meclizine as well as trauma been given to her by her daughter. She also noted today that when she takes out her hearing aids she does not hear as well in both the ears. She states that she cannot tell if one side is worse than the other in terms of her hearing loss. She states her hearing is about at baseline with the hearing aids in place. She has been ambulatory to baseline. She was referred to the emergency department by her primary care doctor for evaluation for possible stroke. They went to urgent care and to the emergency department and referred her here as well (see note below)   Rodolph Bong, MD 08/09/2011 18:46 Cosign Required  Tanya Rice is a 78 y.o. female who presents to Urgent Care today for acute hearing loss and vertigo. Patient developed acute vertigo today at noon and right hearing loss at 2 PM. She take Dramamine which helped with the vertigo. Her primary care doctor advised that she presented to urgent care. She denies any other weakness or numbness but notes chronic difficulty walking secondary to her long hospitalization. She normally walks with a walker at baseline which she does not have it now. She has a hypercoagulable state secondary to myeloproliferative disorder. She is currently on warfarin  and her INR is 1.4. She denies any fevers or chills or current tinnitus.   Past Medical History  Diagnosis Date  . DJD (degenerative joint disease), lumbar Dx 03/2011    Given prednisone & Gabapentin  . Gastric ulcer with hemorrhage     2011 and 2013, Dr. Bluford Kaufmann  . Essential thrombocythemia     Dr. Koleen Nimrod, on HU  . MDS (myelodysplastic syndrome)   . CKD (chronic kidney disease), stage III   . HTN (hypertension)   . HLD (hyperlipidemia)   . Hiatal hernia     Past Surgical History  Procedure Date  . Vaginal delivery     x2 (one set twins)  . Hernia repair     Dr. Gavin Potters and Dr. Michela Pitcher, bilateral  . Breast biopsy 2001    Nml per pt, left    Family History  Problem Relation Age of Onset  . Brain cancer Mother   . Cancer Other     Possible ovarian/uterine  . Heart attack Father     massive but not cause of death    History  Substance Use Topics  . Smoking status: Never Smoker   . Smokeless tobacco: Never Used  . Alcohol Use: No    OB History    Grav Para Term Preterm Abortions TAB SAB Ect Mult Living                  Review of Systems  All other systems reviewed and are negative.  except as noted HPI  Allergies  Hydrocodone; Morphine and related; Nitrofurantoin monohyd macro; Amoxicillin-pot clavulanate; Soy allergy; and Sulfa antibiotics  Home Medications   Current Outpatient Rx  Name Route Sig Dispense Refill  . ASPIRIN EC 81 MG PO TBEC Oral Take 81 mg by mouth daily. On hold (05/24/11)    . BENZONATATE 100 MG PO CAPS Oral Take 100 mg by mouth 3 (three) times daily as needed. For cough    . CALCIUM + D PO Oral Take 1 tablet by mouth daily.     Marland Kitchen CIPROFLOXACIN HCL 250 MG PO TABS Oral Take 1 tablet (250 mg total) by mouth 2 (two) times daily. 14 tablet 0  . ESZOPICLONE 3 MG PO TABS Oral Take 1 tablet (3 mg total) by mouth at bedtime. 30 tablet 3  . FEXOFENADINE HCL 180 MG PO TABS Oral Take 1 tablet (180 mg total) by mouth daily. 30 tablet 6  . HYDROXYUREA  500 MG PO CAPS Oral Take 500-1,000 mg by mouth 2 (two) times daily. Takes 2 caps in the am, 1 cap in the pm    . BOOST PO LIQD Oral Take 1 Container by mouth daily.    Marland Kitchen LEVOTHYROXINE SODIUM 25 MCG PO TABS Oral Take 25 mcg by mouth daily.    Marland Kitchen MECLIZINE HCL 25 MG PO TABS Oral Take 25 mg by mouth 3 (three) times daily as needed. For vertigo    . METOPROLOL TARTRATE 25 MG PO TABS Oral Take 25 mg by mouth daily.     . MULTI-VITAMIN/MINERALS PO TABS Oral Take 1 tablet by mouth daily.    Marland Kitchen PANTOPRAZOLE SODIUM 40 MG PO TBEC Oral Take 1 tablet (40 mg total) by mouth 2 (two) times daily. 180 tablet 1  . WARFARIN SODIUM 2.5 MG PO TABS Oral Take 2.5 mg by mouth every Monday, Wednesday, and Friday.    Barron Alvine SODIUM 3 MG PO TABS Oral Take 3 mg by mouth every Tuesday, Thursday, Saturday, and Sunday. Increase to 3 mg daily for [1] week, recheck INR/PT.      BP 144/68  Pulse 64  Temp(Src) 98.4 F (36.9 C) (Oral)  Resp 18  SpO2 98%  Physical Exam  Nursing note and vitals reviewed. Constitutional: She is oriented to person, place, and time. She appears well-developed.  HENT:  Head: Atraumatic.  Mouth/Throat: Oropharynx is clear and moist.       Cannot visualize Lt TM 2/2 cerumin Rt TM clear  Eyes: Conjunctivae and EOM are normal. Pupils are equal, round, and reactive to light.       No nystagmus  Neck: Normal range of motion. Neck supple.  Cardiovascular: Normal rate, regular rhythm, normal heart sounds and intact distal pulses.   Pulmonary/Chest: Effort normal and breath sounds normal. No respiratory distress. She has no wheezes. She has no rales.  Abdominal: Soft. She exhibits no distension. There is no tenderness. There is no rebound and no guarding.  Musculoskeletal: Normal range of motion.  Neurological: She is alert and oriented to person, place, and time. No cranial nerve deficit. She exhibits normal muscle tone. Coordination normal.       Strength 5/5 all extremities No pronator  drift No facial droop No dysmetria Ambulatory to baseline (normally walks with walker)   Skin: Skin is warm and dry. No rash noted.  Psychiatric: She has a normal mood and affect.    ED Course  Procedures (including critical care time)  Labs Reviewed  CBC - Abnormal; Notable for the following:  WBC 2.6 (*)    RBC 2.73 (*)    Hemoglobin 9.3 (*)    HCT 27.4 (*)    MCV 100.4 (*)    MCH 34.1 (*)    RDW 22.8 (*)    All other components within normal limits  DIFFERENTIAL - Abnormal; Notable for the following:    Neutrophils Relative 33 (*)    Lymphocytes Relative 50 (*)    Monocytes Relative 14 (*)    Neutro Abs 0.9 (*)    All other components within normal limits  BASIC METABOLIC PANEL - Abnormal; Notable for the following:    Sodium 131 (*)    Creatinine, Ser 1.23 (*)    GFR calc non Af Amer 39 (*)    GFR calc Af Amer 46 (*)    All other components within normal limits  HEPATIC FUNCTION PANEL - Abnormal; Notable for the following:    AST 46 (*)    All other components within normal limits   Ct Head Wo Contrast  08/09/2011  *RADIOLOGY REPORT*  Clinical Data: Acute hearing loss and vertigo.  CT HEAD WITHOUT CONTRAST  Technique:  Contiguous axial images were obtained from the base of the skull through the vertex without contrast.  Comparison: Brain MRI from 06/07/2011.  Findings: There is no evidence for acute hemorrhage, hydrocephalus, mass lesion, or abnormal extra-axial fluid collection.  No definite CT evidence for acute infarction.  Diffuse loss of parenchymal volume is consistent with atrophy. Patchy low attenuation in the deep hemispheric and periventricular white matter is nonspecific, but likely reflects chronic microvascular ischemic demyelination. The visualized paranasal sinuses and mastoid air cells are clear.  IMPRESSION: No acute intracranial abnormality.  Atrophy with mild chronic small vessel white matter ischemic demyelination.  Original Report Authenticated By:  ERIC A. MANSELL, M.D.    1. Vertigo   2. Hearing loss     MDM   78yoF with chronic vertigo. Better after dramamine and meclizine. States that it is her chronic symptom and they are more concerned with the hearing loss. MRI 06/2011 without acoustic neuroma. I have low suspicion of acute stroke of posterior circulation. I have discussed this with family as well neurology, Dr. Roseanne Reno. She will follow up with her neurologist for the vertigo which is chronic, she has an appointment to recheck her hearing in 2 days. She has been given strict precautions for return. Patient and family comfortable with the plan.       Forbes Cellar, MD 08/10/11 [phone]

## 2011-08-10 NOTE — Discharge Instructions (Signed)
Vertigo Vertigo means you feel like you or your surroundings are moving when they are not. Vertigo can be dangerous if it occurs when you are at work, driving, or performing difficult activities.  CAUSES  Vertigo occurs when there is a conflict of signals sent to your brain from the visual and sensory systems in your body. There are many different causes of vertigo, including:  Infections, especially in the inner ear.   A bad reaction to a drug or misuse of alcohol and medicines.   Withdrawal from drugs or alcohol.   Rapidly changing positions, such as lying down or rolling over in bed.   A migraine headache.   Decreased blood flow to the brain.   Increased pressure in the brain from a head injury, infection, tumor, or bleeding.  SYMPTOMS  You may feel as though the world is spinning around or you are falling to the ground. Because your balance is upset, vertigo can cause nausea and vomiting. You may have involuntary eye movements (nystagmus). DIAGNOSIS  Vertigo is usually diagnosed by physical exam. If the cause of your vertigo is unknown, your caregiver may perform imaging tests, such as an MRI scan (magnetic resonance imaging). TREATMENT  Most cases of vertigo resolve on their own, without treatment. Depending on the cause, your caregiver may prescribe certain medicines. If your vertigo is related to body position issues, your caregiver may recommend movements or procedures to correct the problem. In rare cases, if your vertigo is caused by certain inner ear problems, you may need surgery. HOME CARE INSTRUCTIONS   Follow your caregiver's instructions.   Avoid driving.   Avoid operating heavy machinery.   Avoid performing any tasks that would be dangerous to you or others during a vertigo episode.   Tell your caregiver if you notice that certain medicines seem to be causing your vertigo. Some of the medicines used to treat vertigo episodes can actually make them worse in some  people.  SEEK IMMEDIATE MEDICAL CARE IF:   Your medicines do not relieve your vertigo or are making it worse.   You develop problems with talking, walking, weakness, or using your arms, hands, or legs.   You develop severe headaches.   Your nausea or vomiting continues or gets worse.   You develop visual changes.   A family member notices behavioral changes.   Your condition gets worse.  MAKE SURE YOU:  Understand these instructions.   Will watch your condition.   Will get help right away if you are not doing well or get worse.  Document Released: 12/30/2004 Document Revised: 03/11/2011 Document Reviewed: 10/08/2010 [facility] Patient Information 2012 [place], [place]. Hearing Loss A hearing loss is sometimes called deafness. Hearing loss may be partial or total. CAUSES Hearing loss may be caused by:  Wax in the ear canal.   Infection of the ear canal.   Infection of the middle ear.   Trauma to the ear or surrounding area.   Fluid in the middle ear.   A hole in the eardrum (perforated eardrum).   Exposure to loud sounds or music.   Problems with the hearing nerve.   Certain medications.  Hearing loss without wax, infection, or a history of injury may mean that the nerve is involved. Hearing loss with severe dizziness, nausea and vomiting or ringing in the ear may suggest a hearing nerve irritation or problems in the middle or inner ear. If hearing loss is untreated, there is a greater likelihood for residual or permanent  hearing loss. DIAGNOSIS A hearing test (audiometry) assesses hearing loss. The audiometry test needs to be performed by a hearing specialist (audiologist). TREATMENT Treatment for recent onset of hearing loss may include:  Ear wax removal.   Medications that kill germs (antibiotics).   Cortisone medications.   Prompt follow up with the appropriate specialist.  Return of hearing depends on the cause of your hearing loss, so proper medical  follow-up is important. Some hearing loss may not be reversible, and a caregiver should discuss care and treatment options with you. SEEK MEDICAL CARE IF:   You have a severe headache, dizziness, or changes in vision.   You have new or increased weakness.   You develop repeated vomiting or other serious medical problems.   You have a fever.  Document Released: 03/22/2005 Document Revised: 03/11/2011 Document Reviewed: 07/17/2009 [facility] Patient Information 2012 [place], [place].

## 2011-08-10 NOTE — Telephone Encounter (Signed)
Caller: Becky/Other; PCP: Ronna Polio; CB#: [phone]; ; ; Call regarding Needing Something Called in for Vertigo;  Mom/Becky is calling to say the pt has continued to be dizzy. See notes from last night. They did go to the ED for possible evaluation of stroke. She is at the ear MD today and the daughter is calling to say that the one thing that they need is her Meclazine which there are no reorders on. Kriste Basque states she has called the pharmacy 1 week ago for a refill and they advised her to call the office so that's what she is doing. She also had ED specifications to f/u  - she did speak with the office and scheduled her out for 08/25/11. Per the ED paperwork there is no dx and no recommended f/u time.  Pt is still dizzy today - requiring 2 people to walk her. There are no changes from yesterday / she seems a bit better. She is at ENT today having a hearing evaluation. She also is returning a call from [facility] concerning Coumadin. Please call her back. OFFICE PLEASE CALL THE DAUGHTER AT [phone]

## 2011-08-10 NOTE — Telephone Encounter (Signed)
We can refill her Meclizine. We should also request notes from ENT evaluation today. She had CT last night which was normal.

## 2011-08-10 NOTE — Telephone Encounter (Signed)
Patient returning your call in regards to the patient coumadin level.

## 2011-08-10 NOTE — Telephone Encounter (Signed)
Tried calling daughter, but got no answer, left message asking her call me back and also advised her that rx for meclizine is being called in.

## 2011-08-11 ENCOUNTER — Other Ambulatory Visit: Payer: Self-pay | Admitting: *Deleted

## 2011-08-11 MED ORDER — WARFARIN SODIUM 4 MG PO TABS: 4.0000 mg | ORAL_TABLET | Freq: Every day | ORAL | Status: DC

## 2011-08-11 NOTE — Telephone Encounter (Signed)
Daughter called back and confirmed that she did received the message.

## 2011-08-12 LAB — CBC CANCER CENTER
Basophil #: 0 x10 3/mm (ref 0.0–0.1)
Basophil %: 0.7 %
Eosinophil #: 0 x10 3/mm (ref 0.0–0.7)
Eosinophil %: 1.2 %
HCT: 28.2 % — ABNORMAL LOW (ref 35.0–47.0)
HGB: 9.3 g/dL — ABNORMAL LOW (ref 12.0–16.0)
MCH: 34.1 pg — ABNORMAL HIGH (ref 26.0–34.0)
MCHC: 33 g/dL (ref 32.0–36.0)
MCV: 103 fL — ABNORMAL HIGH (ref 80–100)
RBC: 2.73 10*6/uL — ABNORMAL LOW (ref 3.80–5.20)
WBC: 1.8 x10 3/mm — CL (ref 3.6–11.0)

## 2011-08-12 LAB — COMPREHENSIVE METABOLIC PANEL
Alkaline Phosphatase: 64 U/L (ref 50–136)
Anion Gap: 9 (ref 7–16)
Bilirubin,Total: 0.6 mg/dL (ref 0.2–1.0)
EGFR (African American): 46 — ABNORMAL LOW
Glucose: 114 mg/dL — ABNORMAL HIGH (ref 65–99)
Osmolality: 272 (ref 275–301)
SGOT(AST): 39 U/L — ABNORMAL HIGH (ref 15–37)
Total Protein: 7.2 g/dL (ref 6.4–8.2)

## 2011-08-12 LAB — PROTIME-INR: Prothrombin Time: 17.8 secs — ABNORMAL HIGH (ref 11.5–14.7)

## 2011-08-13 NOTE — ED Provider Notes (Signed)
Medical screening examination/treatment/procedure(s) were performed by the resident and as supervising physician I was immediately available for consultation/collaboration.  Luiz Blare MD   Luiz Blare, MD 08/13/11 [phone]

## 2011-08-18 ENCOUNTER — Ambulatory Visit (INDEPENDENT_AMBULATORY_CARE_PROVIDER_SITE_OTHER): Payer: Medicare Other | Admitting: Internal Medicine

## 2011-08-18 ENCOUNTER — Encounter: Payer: Self-pay | Admitting: Internal Medicine

## 2011-08-18 ENCOUNTER — Telehealth: Payer: Self-pay | Admitting: Internal Medicine

## 2011-08-18 VITALS — BP 134/74 | HR 56 | Temp 97.4°F | Resp 14 | Wt 117.8 lb

## 2011-08-18 DIAGNOSIS — Z7901 Long term (current) use of anticoagulants: Secondary | ICD-10-CM

## 2011-08-18 DIAGNOSIS — Q068 Other specified congenital malformations of spinal cord: Secondary | ICD-10-CM

## 2011-08-18 DIAGNOSIS — H9190 Unspecified hearing loss, unspecified ear: Secondary | ICD-10-CM

## 2011-08-18 DIAGNOSIS — H9192 Unspecified hearing loss, left ear: Secondary | ICD-10-CM | POA: Insufficient documentation

## 2011-08-18 LAB — COMPREHENSIVE METABOLIC PANEL
Alkaline Phosphatase: 46 U/L (ref 39–117)
Creatinine, Ser: 1.3 mg/dL — ABNORMAL HIGH (ref 0.4–1.2)
GFR: 40.11 mL/min — ABNORMAL LOW (ref >60.00)
Glucose, Bld: 77 mg/dL (ref 70–99)
Sodium: 134 mEq/L — ABNORMAL LOW (ref 135–145)
Total Bilirubin: 0.6 mg/dL (ref 0.3–1.2)
Total Protein: 6.8 g/dL (ref 6.0–8.3)

## 2011-08-18 LAB — CBC WITH DIFFERENTIAL/PLATELET
Hemoglobin: 9.5 g/dL — ABNORMAL LOW (ref 12.0–15.0)
MCHC: 33.8 g/dL (ref 30.0–36.0)
RBC: 2.67 Mil/uL — ABNORMAL LOW (ref 3.87–5.11)
WBC: 2.9 10*3/uL — ABNORMAL LOW (ref 4.5–10.5)

## 2011-08-18 LAB — PROTIME-INR: Prothrombin Time: 19.8 s — ABNORMAL HIGH (ref 10.2–12.4)

## 2011-08-18 NOTE — Assessment & Plan Note (Signed)
Unclear etiology. Question if patient may have had stroke that was not visible on imaging. She has followup with ENT to assess any progress after steroid injection. Will continue to monitor.

## 2011-08-18 NOTE — Progress Notes (Signed)
Subjective:    Patient ID: Tanya Rice, female    DOB: Aug 28, 1926, 78 y.o.   MRN: [id number]  HPI 78 year old female with history of myelodysplasia, recent episode of portal vein thrombosis, presents for followup after recent hospitalization for left sided hearing loss. She had acute onset of hearing loss approximately one week ago. She was evaluated in the emergency room with CT head and MRI brain which were normal. She was then seen by ENT and given an injection of steroids into her middle ear. She reports no improvement in her hearing. She denies any pain in her left ear. She denies dizziness. She otherwise reports that she is feeling well.  She notes that she was recently seen by her hematologist in her hydroxyurea was stopped. She continues on Coumadin for her chronic anticoagulation after portal vein thrombosis. Her last INR was subtherapeutic and her dose of Coumadin was increased.  Outpatient Encounter Prescriptions as of 08/18/2011  Medication Sig Dispense Refill  . aspirin EC 81 MG tablet Take 81 mg by mouth daily. On hold (05/24/11)      . benzonatate (TESSALON) 100 MG capsule Take 100 mg by mouth 3 (three) times daily as needed. For cough      . Calcium Carbonate-Vitamin D (CALCIUM + D PO) Take 1 tablet by mouth daily.       . Eszopiclone 3 MG TABS Take 1 tablet (3 mg total) by mouth at bedtime.  30 tablet  3  . fexofenadine (ALLEGRA) 180 MG tablet Take 1 tablet (180 mg total) by mouth daily.  30 tablet  6  . lactose free nutrition (BOOST) LIQD Take 1 Container by mouth daily.      Marland Kitchen levothyroxine (SYNTHROID, LEVOTHROID) 25 MCG tablet Take 25 mcg by mouth daily.      . meclizine (ANTIVERT) 25 MG tablet Take 1 tablet (25 mg total) by mouth 3 (three) times daily as needed. For vertigo  30 tablet  0  . metoprolol tartrate (LOPRESSOR) 25 MG tablet Take 25 mg by mouth daily.       . Multiple Vitamins-Minerals (MULTIVITAMIN WITH MINERALS) tablet Take 1 tablet by mouth daily.      .  pantoprazole (PROTONIX) 40 MG tablet Take 1 tablet (40 mg total) by mouth 2 (two) times daily.  180 tablet  1  . warfarin (COUMADIN) 4 MG tablet Take 1 tablet (4 mg total) by mouth daily.  30 tablet  2  . warfarin (COUMADIN) 2.5 MG tablet Take 2.5 mg by mouth every Monday, Wednesday, and Friday.      . warfarin (COUMADIN) 3 MG tablet Take 3 mg by mouth every Tuesday, Thursday, Saturday, and Sunday. Increase to 3 mg daily for [1] week, recheck INR/PT.      . DISCONTD: hydroxyurea (HYDREA) 500 MG capsule Take 500-1,000 mg by mouth 2 (two) times daily. Takes 2 caps in the am, 1 cap in the pm       BP 134/74  Pulse 56  Temp(Src) 97.4 F (36.3 C) (Oral)  Resp 14  Wt 117 lb 12 oz (53.411 kg)  SpO2 99%  Review of Systems  Constitutional: Positive for fatigue. Negative for fever, chills, appetite change and unexpected weight change.  HENT: Positive for hearing loss. Negative for ear pain, congestion, sore throat, trouble swallowing, neck pain, voice change and sinus pressure.   Eyes: Negative for visual disturbance.  Respiratory: Negative for cough, shortness of breath, wheezing and stridor.   Cardiovascular: Negative for chest pain, palpitations and  leg swelling.  Gastrointestinal: Negative for nausea, vomiting, abdominal pain, diarrhea, constipation, blood in stool, abdominal distention and anal bleeding.  Genitourinary: Negative for dysuria and flank pain.  Musculoskeletal: Negative for myalgias, arthralgias and gait problem.  Skin: Negative for color change and rash.  Neurological: Negative for dizziness and headaches.  Hematological: Negative for adenopathy. Does not bruise/bleed easily.  Psychiatric/Behavioral: Negative for suicidal ideas, sleep disturbance and dysphoric mood. The patient is not nervous/anxious.        Objective:   Physical Exam  Constitutional: She is oriented to person, place, and time. She appears well-developed and well-nourished. No distress.  HENT:  Head:  Normocephalic and atraumatic.  Right Ear: External ear normal.  Left Ear: External ear normal.  Nose: Nose normal.  Mouth/Throat: Oropharynx is clear and moist. No oropharyngeal exudate.  Eyes: Conjunctivae are normal. Pupils are equal, round, and reactive to light. Right eye exhibits no discharge. Left eye exhibits no discharge. No scleral icterus.  Neck: Normal range of motion. Neck supple. No tracheal deviation present. No thyromegaly present.  Cardiovascular: Normal rate, regular rhythm, normal heart sounds and intact distal pulses.  Exam reveals no gallop and no friction rub.   No murmur heard. Pulmonary/Chest: Effort normal and breath sounds normal. No respiratory distress. She has no wheezes. She has no rales. She exhibits no tenderness.  Musculoskeletal: Normal range of motion. She exhibits no edema and no tenderness.  Lymphadenopathy:    She has no cervical adenopathy.  Neurological: She is alert and oriented to person, place, and time. No cranial nerve deficit. She exhibits normal muscle tone. Coordination normal.  Skin: Skin is warm and dry. No rash noted. She is not diaphoretic. No erythema. No pallor.  Psychiatric: She has a normal mood and affect. Her behavior is normal. Judgment and thought content normal.          Assessment & Plan:

## 2011-08-18 NOTE — Telephone Encounter (Signed)
This has already been done.

## 2011-08-18 NOTE — Assessment & Plan Note (Signed)
Patient's hydroxyurea was recently discontinued by her hematologist. Will recheck CBC with labs today.

## 2011-08-18 NOTE — Telephone Encounter (Signed)
Becky called to see if you would fax an order to dr Koleen Nimrod [facility]  Fax [phone] So ms Zechman can get her labs done there when she sees him 08/26/11  For pt?? Please advise

## 2011-08-18 NOTE — Assessment & Plan Note (Signed)
Will check INR with labs today. Goal INR 2-3. 

## 2011-08-23 ENCOUNTER — Ambulatory Visit: Payer: Medicare Other | Admitting: Internal Medicine

## 2011-08-25 ENCOUNTER — Ambulatory Visit: Payer: Medicare Other | Admitting: Internal Medicine

## 2011-08-26 ENCOUNTER — Telehealth: Payer: Self-pay | Admitting: Internal Medicine

## 2011-08-26 LAB — CBC CANCER CENTER
Eosinophil #: 0.1 x10 3/mm (ref 0.0–0.7)
Eosinophil %: 2.7 %
HGB: 9.2 g/dL — ABNORMAL LOW (ref 12.0–16.0)
MCV: 110 fL — ABNORMAL HIGH (ref 80–100)
Neutrophil #: 2.7 x10 3/mm (ref 1.4–6.5)
Platelet: 298 x10 3/mm (ref 150–440)
RBC: 2.58 10*6/uL — ABNORMAL LOW (ref 3.80–5.20)
WBC: 4.3 x10 3/mm (ref 3.6–11.0)

## 2011-08-26 LAB — COMPREHENSIVE METABOLIC PANEL
Anion Gap: 10 (ref 7–16)
Creatinine: 1.41 mg/dL — ABNORMAL HIGH (ref 0.60–1.30)
EGFR (African American): 40 — ABNORMAL LOW
EGFR (Non-African Amer.): 34 — ABNORMAL LOW
Glucose: 100 mg/dL — ABNORMAL HIGH (ref 65–99)
Osmolality: 275 (ref 275–301)
SGOT(AST): 41 U/L — ABNORMAL HIGH (ref 15–37)
SGPT (ALT): 36 U/L
Sodium: 136 mmol/L (ref 136–145)

## 2011-08-26 MED ORDER — WARFARIN SODIUM 5 MG PO TABS: 5.0000 mg | ORAL_TABLET | Freq: Every day | ORAL | Status: DC

## 2011-08-26 NOTE — Telephone Encounter (Signed)
Daughter informed of VO per JAW increase coumadin to 5mg  daily, recheck INR in 1 week; new Rx to pharmacy, lab visit scheduled Thursday, 05.30.13/SLS

## 2011-08-26 NOTE — Telephone Encounter (Signed)
[phone] OR [phone]  Tanya Rice came in today and stated that dr Doylene Canning does not want to do the protime at it office.  Tanya Rice brought lab results in today for Tanya Rice that dr Doylene Canning  today Please advise pt on what to do with coumdian When would her next lab visit be

## 2011-08-27 ENCOUNTER — Telehealth: Payer: Self-pay | Admitting: *Deleted

## 2011-08-27 NOTE — Telephone Encounter (Signed)
Informed patient's daughter, Loraine Leriche HIPAA], that she established care in Feb 2013 w/ Dr Modesto Charon, so they will need to call and make appointment for future OV. Caller understood & agreed/SLS

## 2011-09-01 ENCOUNTER — Telehealth: Payer: Self-pay | Admitting: Internal Medicine

## 2011-09-01 NOTE — Telephone Encounter (Signed)
INR perfect. Repeat INR in 2 weeks

## 2011-09-01 NOTE — Telephone Encounter (Signed)
Daughter notified. Lab appt scheduled.

## 2011-09-02 ENCOUNTER — Ambulatory Visit: Payer: Medicare Other

## 2011-09-04 ENCOUNTER — Ambulatory Visit: Payer: Self-pay | Admitting: Oncology

## 2011-09-15 ENCOUNTER — Ambulatory Visit (INDEPENDENT_AMBULATORY_CARE_PROVIDER_SITE_OTHER): Payer: Medicare Other | Admitting: *Deleted

## 2011-09-15 DIAGNOSIS — E538 Deficiency of other specified B group vitamins: Secondary | ICD-10-CM

## 2011-09-15 DIAGNOSIS — Z79899 Other long term (current) drug therapy: Secondary | ICD-10-CM

## 2011-09-15 DIAGNOSIS — Z7901 Long term (current) use of anticoagulants: Secondary | ICD-10-CM

## 2011-09-15 LAB — BASIC METABOLIC PANEL
Chloride: 94 mEq/L — ABNORMAL LOW (ref 96–112)
GFR: 27.99 mL/min — ABNORMAL LOW (ref >60.00)
Potassium: 4.1 mEq/L (ref 3.5–5.1)
Sodium: 134 mEq/L — ABNORMAL LOW (ref 135–145)

## 2011-09-15 LAB — PROTIME-INR
INR: 2.9 ratio — ABNORMAL HIGH (ref 0.8–1.0)
Prothrombin Time: 31.8 s — ABNORMAL HIGH (ref 10.2–12.4)

## 2011-09-15 MED ORDER — CYANOCOBALAMIN 1000 MCG/ML IJ SOLN
1000.0000 ug | Freq: Once | INTRAMUSCULAR | Status: AC
  Administered 2011-09-15: 1000 ug via INTRAMUSCULAR

## 2011-09-21 ENCOUNTER — Other Ambulatory Visit: Payer: Self-pay | Admitting: *Deleted

## 2011-09-21 ENCOUNTER — Ambulatory Visit (INDEPENDENT_AMBULATORY_CARE_PROVIDER_SITE_OTHER): Payer: Medicare Other | Admitting: Internal Medicine

## 2011-09-21 ENCOUNTER — Telehealth: Payer: Self-pay | Admitting: *Deleted

## 2011-09-21 ENCOUNTER — Encounter: Payer: Self-pay | Admitting: Internal Medicine

## 2011-09-21 VITALS — BP 120/70 | HR 60 | Temp 98.6°F | Ht 61.0 in | Wt 117.5 lb

## 2011-09-21 DIAGNOSIS — E538 Deficiency of other specified B group vitamins: Secondary | ICD-10-CM

## 2011-09-21 DIAGNOSIS — Z7901 Long term (current) use of anticoagulants: Secondary | ICD-10-CM

## 2011-09-21 DIAGNOSIS — N183 Chronic kidney disease, stage 3 unspecified: Secondary | ICD-10-CM

## 2011-09-21 DIAGNOSIS — G629 Polyneuropathy, unspecified: Secondary | ICD-10-CM

## 2011-09-21 DIAGNOSIS — G4700 Insomnia, unspecified: Secondary | ICD-10-CM

## 2011-09-21 DIAGNOSIS — G589 Mononeuropathy, unspecified: Secondary | ICD-10-CM

## 2011-09-21 LAB — CBC WITH DIFFERENTIAL/PLATELET
Basophils Absolute: 0 10*3/uL (ref 0.0–0.1)
Basophils Relative: 0.1 % (ref 0.0–3.0)
Eosinophils Absolute: 0.3 10*3/uL (ref 0.0–0.7)
Lymphocytes Relative: 18.1 % (ref 12.0–46.0)
MCHC: 32.7 g/dL (ref 30.0–36.0)
MCV: 104.2 fl — ABNORMAL HIGH (ref 78.0–100.0)
Monocytes Absolute: 1.3 10*3/uL — ABNORMAL HIGH (ref 0.1–1.0)
Neutro Abs: 7 10*3/uL (ref 1.4–7.7)
Neutrophils Relative %: 66.2 % (ref 43.0–77.0)
RBC: 3.19 Mil/uL — ABNORMAL LOW (ref 3.87–5.11)
RDW: 27.7 % — ABNORMAL HIGH (ref 11.5–14.6)

## 2011-09-21 LAB — COMPREHENSIVE METABOLIC PANEL WITH GFR
ALT: 31 U/L (ref 0–35)
AST: 37 U/L (ref 0–37)
Albumin: 4.3 g/dL (ref 3.5–5.2)
Alkaline Phosphatase: 73 U/L (ref 39–117)
BUN: 46 mg/dL — ABNORMAL HIGH (ref 6–23)
CO2: 28 meq/L (ref 19–32)
Calcium: 9.1 mg/dL (ref 8.4–10.5)
Chloride: 92 meq/L — ABNORMAL LOW (ref 96–112)
Creatinine, Ser: 2 mg/dL — ABNORMAL HIGH (ref 0.4–1.2)
GFR: 24.69 mL/min — ABNORMAL LOW
Glucose, Bld: 56 mg/dL — ABNORMAL LOW (ref 70–99)
Potassium: 4.2 meq/L (ref 3.5–5.1)
Sodium: 130 meq/L — ABNORMAL LOW (ref 135–145)
Total Bilirubin: 0.1 mg/dL — ABNORMAL LOW (ref 0.3–1.2)
Total Protein: 8.7 g/dL — ABNORMAL HIGH (ref 6.0–8.3)

## 2011-09-21 LAB — PROTIME-INR
INR: 2.5 ratio — ABNORMAL HIGH (ref 0.8–1.0)
Prothrombin Time: 28.2 s — ABNORMAL HIGH (ref 10.2–12.4)

## 2011-09-21 MED ORDER — FUROSEMIDE 40 MG PO TABS: 40.0000 mg | ORAL_TABLET | Freq: Every day | ORAL | Status: DC

## 2011-09-21 MED ORDER — ESZOPICLONE 3 MG PO TABS: 3.0000 mg | ORAL_TABLET | Freq: Every day | ORAL | Status: DC

## 2011-09-21 NOTE — Assessment & Plan Note (Signed)
Symptoms persistent despite change in medication from Neurontin to Lyrica and now to diazepam. Results from EEG testing not available. We'll try to obtain these. Will set up followup with her neurologist.

## 2011-09-21 NOTE — Assessment & Plan Note (Signed)
Improved on Lunesta, will continue.

## 2011-09-21 NOTE — Assessment & Plan Note (Signed)
Will give B12 shot in 3 weeks. Plan for monthly B12 shots.

## 2011-09-21 NOTE — Telephone Encounter (Signed)
Call Report: Critical Platelet count 1,332.  Spoke with Dr. Dan Humphreys and patient is being followed by Hematologist.

## 2011-09-21 NOTE — Assessment & Plan Note (Signed)
Will check renal function with labs today. 

## 2011-09-21 NOTE — Assessment & Plan Note (Signed)
Secondary to portal vein thrombosis. Will check INR with labs today. Goal INR 2-3.

## 2011-09-21 NOTE — Progress Notes (Signed)
Subjective:    Patient ID: Tanya Rice, female    DOB: May 03, 1926, 78 y.o.   MRN: [id number]  HPI 78 year old female with history of portal vein thrombosis, essential thrombocythemia, chronic right-sided sciatic pain and neuropathy presents for followup. Her primary concern today is persistent burning pain in her feet. She notes that her podiatrist initially tried changing her Neurontin to Lyrica with no improvement. She then tried her on diazepam. She continues to have burning pain in her feet which persists throughout the day. She also has right-sided sciatic pain which runs down the back of her leg. These are chronic issues which have been going on for several months.  In regards to her history of essential thrombocythemia, she notes she continues to be followed by hematology. She is currently off hydroxyurea. She reports that she is feeling well. She notes that blood counts are being monitored by her hematologist.  In regards to her history of hypothyroidism and hypertension, she reports full compliance with her medications. She reports that her energy level is gradually improving. Her appetite has also improved.  Outpatient Encounter Prescriptions as of 09/21/2011  Medication Sig Dispense Refill  . aspirin EC 81 MG tablet Take 81 mg by mouth daily. On hold (05/24/11)      . benzonatate (TESSALON) 100 MG capsule Take 100 mg by mouth 3 (three) times daily as needed. For cough      . Calcium Carbonate-Vitamin D (CALCIUM + D PO) Take 1 tablet by mouth daily.       . diazepam (VALIUM) 2 MG tablet Take 2 mg by mouth at bedtime.      . Eszopiclone 3 MG TABS Take 1 tablet (3 mg total) by mouth at bedtime.  30 tablet  3  . fexofenadine (ALLEGRA) 180 MG tablet Take 1 tablet (180 mg total) by mouth daily.  30 tablet  6  . furosemide (LASIX) 40 MG tablet Take 40 mg by mouth daily.      Marland Kitchen lactose free nutrition (BOOST) LIQD Take 1 Container by mouth daily.      Marland Kitchen levothyroxine (SYNTHROID, LEVOTHROID)  25 MCG tablet Take 25 mcg by mouth daily.      . meclizine (ANTIVERT) 25 MG tablet Take 1 tablet (25 mg total) by mouth 3 (three) times daily as needed. For vertigo  30 tablet  0  . metoprolol tartrate (LOPRESSOR) 25 MG tablet Take 25 mg by mouth daily.       . Multiple Vitamins-Minerals (MULTIVITAMIN WITH MINERALS) tablet Take 1 tablet by mouth daily.      . pantoprazole (PROTONIX) 40 MG tablet Take 1 tablet (40 mg total) by mouth 2 (two) times daily.  180 tablet  1  . warfarin (COUMADIN) 2.5 MG tablet Take 2.5 mg by mouth every Monday, Wednesday, and Friday.      . warfarin (COUMADIN) 3 MG tablet Take 3 mg by mouth every Tuesday, Thursday, Saturday, and Sunday. Increase to 3 mg daily for [1] week, recheck INR/PT.      Marland Kitchen warfarin (COUMADIN) 4 MG tablet Take 1 tablet (4 mg total) by mouth daily.  30 tablet  2  . warfarin (COUMADIN) 5 MG tablet Take 1 tablet (5 mg total) by mouth daily.  30 tablet  0  . DISCONTD: Eszopiclone 3 MG TABS Take 1 tablet (3 mg total) by mouth at bedtime.  30 tablet  3   BP 120/70  Pulse 60  Temp 98.6 F (37 C) (Oral)  Ht 5\' 1"  (1.549  m)  Wt 117 lb 8 oz (53.298 kg)  BMI 22.20 kg/m2  SpO2 93%  Review of Systems  Constitutional: Negative for fever, chills, appetite change, fatigue and unexpected weight change.  HENT: Negative for ear pain, congestion, sore throat, trouble swallowing, neck pain, voice change and sinus pressure.   Eyes: Negative for visual disturbance.  Respiratory: Negative for cough, shortness of breath, wheezing and stridor.   Cardiovascular: Negative for chest pain, palpitations and leg swelling.  Gastrointestinal: Negative for nausea, vomiting, abdominal pain, diarrhea, constipation, blood in stool, abdominal distention and anal bleeding.  Genitourinary: Negative for dysuria and flank pain.  Musculoskeletal: Positive for myalgias and arthralgias. Negative for gait problem.  Skin: Negative for color change and rash.  Neurological: Negative for  dizziness and headaches.  Hematological: Negative for adenopathy. Does not bruise/bleed easily.  Psychiatric/Behavioral: Negative for suicidal ideas, disturbed wake/sleep cycle and dysphoric mood. The patient is not nervous/anxious.        Objective:   Physical Exam  Constitutional: She is oriented to person, place, and time. She appears well-developed and well-nourished. No distress.  HENT:  Head: Normocephalic and atraumatic.  Right Ear: External ear normal.  Left Ear: External ear normal.  Nose: Nose normal.  Mouth/Throat: Oropharynx is clear and moist. No oropharyngeal exudate.  Eyes: Conjunctivae are normal. Pupils are equal, round, and reactive to light. Right eye exhibits no discharge. Left eye exhibits no discharge. No scleral icterus.  Neck: Normal range of motion. Neck supple. No tracheal deviation present. No thyromegaly present.  Cardiovascular: Normal rate, regular rhythm, normal heart sounds and intact distal pulses.  Exam reveals no gallop and no friction rub.   No murmur heard. Pulmonary/Chest: Effort normal and breath sounds normal. No respiratory distress. She has no wheezes. She has no rales. She exhibits no tenderness.  Musculoskeletal: Normal range of motion. She exhibits no edema and no tenderness.  Lymphadenopathy:    She has no cervical adenopathy.  Neurological: She is alert and oriented to person, place, and time. No cranial nerve deficit. She exhibits normal muscle tone. Coordination normal.  Skin: Skin is warm and dry. No rash noted. She is not diaphoretic. No erythema. No pallor.  Psychiatric: She has a normal mood and affect. Her behavior is normal. Judgment and thought content normal.          Assessment & Plan:

## 2011-09-23 LAB — COMPREHENSIVE METABOLIC PANEL
Albumin: 3.7 g/dL (ref 3.4–5.0)
Alkaline Phosphatase: 88 U/L (ref 50–136)
Anion Gap: 8 (ref 7–16)
BUN: 51 mg/dL — ABNORMAL HIGH (ref 7–18)
Calcium, Total: 9.1 mg/dL (ref 8.5–10.1)
Chloride: 94 mmol/L — ABNORMAL LOW (ref 98–107)
Co2: 28 mmol/L (ref 21–32)
Creatinine: 2.35 mg/dL — ABNORMAL HIGH (ref 0.60–1.30)
EGFR (African American): 21 — ABNORMAL LOW
EGFR (Non-African Amer.): 19 — ABNORMAL LOW
Osmolality: 273 (ref 275–301)
Potassium: 4.4 mmol/L (ref 3.5–5.1)
SGOT(AST): 44 U/L — ABNORMAL HIGH (ref 15–37)
Sodium: 130 mmol/L — ABNORMAL LOW (ref 136–145)

## 2011-09-23 LAB — CBC CANCER CENTER
Basophil #: 0 x10 3/mm (ref 0.0–0.1)
Eosinophil #: 0.2 x10 3/mm (ref 0.0–0.7)
HCT: 32.3 % — ABNORMAL LOW (ref 35.0–47.0)
Lymphocyte #: 1.7 x10 3/mm (ref 1.0–3.6)
Lymphocyte %: 16.4 %
MCH: 34.1 pg — ABNORMAL HIGH (ref 26.0–34.0)
Monocyte #: 1.2 x10 3/mm — ABNORMAL HIGH (ref 0.2–0.9)
Monocyte %: 11.7 %
Neutrophil #: 7.1 x10 3/mm — ABNORMAL HIGH (ref 1.4–6.5)
Neutrophil %: 69.3 %
RDW: 26.3 % — ABNORMAL HIGH (ref 11.5–14.5)

## 2011-09-28 ENCOUNTER — Telehealth: Payer: Self-pay | Admitting: Internal Medicine

## 2011-09-28 ENCOUNTER — Ambulatory Visit: Payer: Medicare Other

## 2011-09-28 ENCOUNTER — Ambulatory Visit (INDEPENDENT_AMBULATORY_CARE_PROVIDER_SITE_OTHER): Payer: Medicare Other | Admitting: *Deleted

## 2011-09-28 DIAGNOSIS — R300 Dysuria: Secondary | ICD-10-CM

## 2011-09-28 LAB — POCT URINALYSIS DIPSTICK
Nitrite, UA: NEGATIVE
Protein, UA: NEGATIVE
Spec Grav, UA: <=1.005
Urobilinogen, UA: 0.2

## 2011-09-28 MED ORDER — CIPROFLOXACIN HCL 250 MG PO TABS: 250.0000 mg | ORAL_TABLET | Freq: Two times a day (BID) | ORAL | Status: DC

## 2011-09-28 NOTE — Telephone Encounter (Signed)
At check out patient was asking about referral to Dr. Ernest Pine, she states he is an orthopedic doctor. I don't see a referral for him, please advise.

## 2011-09-28 NOTE — Addendum Note (Signed)
Addended by: Jobie Quaker on: 09/28/2011 02:04 PM   Modules accepted: Orders

## 2011-09-28 NOTE — Telephone Encounter (Signed)
Lets wait and I will discuss this with her at her visit this week.

## 2011-09-29 ENCOUNTER — Ambulatory Visit: Payer: Medicare Other | Admitting: Internal Medicine

## 2011-09-30 ENCOUNTER — Ambulatory Visit: Payer: Medicare Other | Admitting: Internal Medicine

## 2011-09-30 ENCOUNTER — Encounter: Payer: Self-pay | Admitting: Internal Medicine

## 2011-09-30 ENCOUNTER — Ambulatory Visit (INDEPENDENT_AMBULATORY_CARE_PROVIDER_SITE_OTHER): Payer: Medicare Other | Admitting: Internal Medicine

## 2011-09-30 VITALS — BP 112/60 | HR 54 | Temp 98.3°F | Ht 61.0 in | Wt 116.5 lb

## 2011-09-30 DIAGNOSIS — G629 Polyneuropathy, unspecified: Secondary | ICD-10-CM

## 2011-09-30 DIAGNOSIS — I81 Portal vein thrombosis: Secondary | ICD-10-CM

## 2011-09-30 DIAGNOSIS — D473 Essential (hemorrhagic) thrombocythemia: Secondary | ICD-10-CM

## 2011-09-30 DIAGNOSIS — D510 Vitamin B12 deficiency anemia due to intrinsic factor deficiency: Secondary | ICD-10-CM

## 2011-09-30 DIAGNOSIS — K5900 Constipation, unspecified: Secondary | ICD-10-CM | POA: Insufficient documentation

## 2011-09-30 DIAGNOSIS — M25559 Pain in unspecified hip: Secondary | ICD-10-CM

## 2011-09-30 DIAGNOSIS — M25551 Pain in right hip: Secondary | ICD-10-CM

## 2011-09-30 DIAGNOSIS — N390 Urinary tract infection, site not specified: Secondary | ICD-10-CM

## 2011-09-30 DIAGNOSIS — G589 Mononeuropathy, unspecified: Secondary | ICD-10-CM

## 2011-09-30 MED ORDER — HYDROXYUREA 500 MG PO CAPS: 500.0000 mg | ORAL_CAPSULE | Freq: Every day | ORAL | Status: DC

## 2011-09-30 MED ORDER — CYANOCOBALAMIN 1000 MCG/ML IJ SOLN
1000.0000 ug | Freq: Once | INTRAMUSCULAR | Status: AC
  Administered 2011-09-30: 1000 ug via INTRAMUSCULAR

## 2011-09-30 MED ORDER — LACTULOSE 10 GM/15ML PO SOLN: 20.0000 g | ORAL | Status: AC | PRN

## 2011-09-30 NOTE — Assessment & Plan Note (Signed)
Chronic. Secondary osteoarthritis. Will set up a followup with orthopedics. Question if she might be a candidate for local steroid injection rather than systemic steroids.

## 2011-09-30 NOTE — Assessment & Plan Note (Signed)
Reviewed recent EMG testing with patient today which showed no evidence of peripheral neuropathy.

## 2011-09-30 NOTE — Progress Notes (Signed)
Subjective:    Patient ID: CAROLE DONER, female    DOB: 01-08-1927, 78 y.o.   MRN: [id number]  HPI 78 year old female with history of essential thrombocythemia, chronic kidney disease, history of portal vein thrombosis on chronic anticoagulation, and hearing loss presents for followup. In regards to essential thrombocythemia, she notes she was recently started back on hydroxyurea by her oncologist. Last platelet count was 1.2 million. She has followup with her oncologist next week to recheck platelets.  In regards to chronic kidney disease, she was recently seen by her nephrologist and noted to have elevated creatinine at 2. Of note, she had been started on spironolactone by a podiatrist. This medication was stopped and there is plan to recheck her creatinine next week.  In regards to chronic anticoagulation, recent Coumadin level is therapeutic. She has not had any bleeding or bruising.  In regards to hearing loss and vertigo, she notes she was recently seen by her ENT physician because of worsening vertigo. She has been set up for vestibular therapy.  She is concerned today about persistent right hip pain secondary to osteoarthritis. This is been ongoing for several years. She was initially seen by orthopedics and was placed on prednisone taper which resulted in gastritis and gastric hemorrhage. She continues to have pain in her right hip which radiates down her right leg. She would like to followup with orthopedics to see if any other interventions might be helpful.  Patient is also concerned today about constipation. She reports no bowel movement in the last 3 days. She has been taking fiber supplement and took one dose of Dulcolax with no BM. She denies abdominal pain or distention.  Earlier this week, patient presented to office complaining of dysuria. Urinalysis was concerning for infection. Urine was sent for culture and this showed Escherichia coli. Patient was started on Cipro. She  reports significant improvement in symptoms of dysuria and frequency. She denies any fever or chills.  Outpatient Encounter Prescriptions as of 09/30/2011  Medication Sig Dispense Refill  . aspirin EC 81 MG tablet Take 81 mg by mouth daily. On hold (05/24/11)      . benzonatate (TESSALON) 100 MG capsule Take 100 mg by mouth 3 (three) times daily as needed. For cough      . Calcium Carbonate-Vitamin D (CALCIUM + D PO) Take 1 tablet by mouth daily.       . ciprofloxacin (CIPRO) 250 MG tablet Take 1 tablet (250 mg total) by mouth 2 (two) times daily.  14 tablet  0  . diazepam (VALIUM) 2 MG tablet Take 2 mg by mouth at bedtime.      . Eszopiclone 3 MG TABS Take 1 tablet (3 mg total) by mouth at bedtime.  30 tablet  3  . fexofenadine (ALLEGRA) 180 MG tablet Take 1 tablet (180 mg total) by mouth daily.  30 tablet  6  . furosemide (LASIX) 40 MG tablet Take 1 tablet (40 mg total) by mouth daily.  30 tablet  12  . lactose free nutrition (BOOST) LIQD Take 1 Container by mouth daily.      Marland Kitchen levothyroxine (SYNTHROID, LEVOTHROID) 25 MCG tablet Take 25 mcg by mouth daily.      . meclizine (ANTIVERT) 25 MG tablet Take 1 tablet (25 mg total) by mouth 3 (three) times daily as needed. For vertigo  30 tablet  0  . metoprolol tartrate (LOPRESSOR) 25 MG tablet Take 25 mg by mouth daily.       . Multiple  Vitamins-Minerals (MULTIVITAMIN WITH MINERALS) tablet Take 1 tablet by mouth daily.      . pantoprazole (PROTONIX) 40 MG tablet Take 1 tablet (40 mg total) by mouth 2 (two) times daily.  180 tablet  1  . warfarin (COUMADIN) 2.5 MG tablet Take 2.5 mg by mouth every Monday, Wednesday, and Friday.      . warfarin (COUMADIN) 3 MG tablet Take 3 mg by mouth every Tuesday, Thursday, Saturday, and Sunday. Increase to 3 mg daily for [1] week, recheck INR/PT.      Marland Kitchen warfarin (COUMADIN) 4 MG tablet Take 1 tablet (4 mg total) by mouth daily.  30 tablet  2  . warfarin (COUMADIN) 5 MG tablet Take 1 tablet (5 mg total) by mouth  daily.  30 tablet  0  . hydroxyurea (HYDREA) 500 MG capsule Take 1 capsule (500 mg total) by mouth daily. Takes 2 caps in the am, 1 cap in the pm  30 capsule  3  . lactulose (CHRONULAC) 10 GM/15ML solution Take 30 mLs (20 g total) by mouth every 2 (two) hours as needed (Take every 2 hours up to three doses or until BM).  120 mL  0   Facility-Administered Encounter Medications as of 09/30/2011  Medication Dose Route Frequency Provider Last Rate Last Dose  . cyanocobalamin ((VITAMIN B-12)) injection 1,000 mcg  1,000 mcg Intramuscular Once Shelia Media, MD   1,000 mcg at 09/30/11 1546   BP 112/60  Pulse 54  Temp 98.3 F (36.8 C) (Oral)  Ht 5\' 1"  (1.549 m)  Wt 116 lb 8 oz (52.844 kg)  BMI 22.01 kg/m2  SpO2 98%  Review of Systems  Constitutional: Positive for fatigue. Negative for fever, chills, appetite change and unexpected weight change.  HENT: Positive for hearing loss. Negative for ear pain, congestion, sore throat, trouble swallowing, neck pain, voice change and sinus pressure.   Eyes: Negative for visual disturbance.  Respiratory: Negative for cough, shortness of breath, wheezing and stridor.   Cardiovascular: Negative for chest pain, palpitations and leg swelling.  Gastrointestinal: Negative for nausea, vomiting, abdominal pain, diarrhea, constipation, blood in stool, abdominal distention and anal bleeding.  Genitourinary: Negative for dysuria and flank pain.  Musculoskeletal: Positive for arthralgias and gait problem. Negative for myalgias.  Skin: Negative for color change and rash.  Neurological: Positive for dizziness. Negative for headaches.  Hematological: Negative for adenopathy. Does not bruise/bleed easily.  Psychiatric/Behavioral: Negative for suicidal ideas, disturbed wake/sleep cycle and dysphoric mood. The patient is not nervous/anxious.        Objective:   Physical Exam  Constitutional: She is oriented to person, place, and time. She appears well-developed and  well-nourished. No distress.  HENT:  Head: Normocephalic and atraumatic.  Right Ear: External ear normal.  Left Ear: External ear normal.  Nose: Nose normal.  Mouth/Throat: Oropharynx is clear and moist. No oropharyngeal exudate.  Eyes: Conjunctivae are normal. Pupils are equal, round, and reactive to light. Right eye exhibits no discharge. Left eye exhibits no discharge. No scleral icterus.  Neck: Normal range of motion. Neck supple. No tracheal deviation present. No thyromegaly present.  Cardiovascular: Normal rate, regular rhythm, normal heart sounds and intact distal pulses.  Exam reveals no gallop and no friction rub.   No murmur heard. Pulmonary/Chest: Effort normal and breath sounds normal. No respiratory distress. She has no wheezes. She has no rales. She exhibits no tenderness.  Musculoskeletal: She exhibits no edema and no tenderness.       Right hip:  She exhibits decreased range of motion.  Lymphadenopathy:    She has no cervical adenopathy.  Neurological: She is alert and oriented to person, place, and time. No cranial nerve deficit. She exhibits normal muscle tone. Coordination normal.  Skin: Skin is warm and dry. No rash noted. She is not diaphoretic. No erythema. No pallor.  Psychiatric: She has a normal mood and affect. Her behavior is normal. Judgment and thought content normal.          Assessment & Plan:

## 2011-09-30 NOTE — Assessment & Plan Note (Signed)
Recently started back on hydroxyurea because of platelet count of 1.3 million. Plan to follow up with oncology next week.

## 2011-09-30 NOTE — Assessment & Plan Note (Signed)
Recent INR was therapeutic. We'll continue Coumadin. Goal INR 2-3. Plan for repeat INR in one month.

## 2011-09-30 NOTE — Assessment & Plan Note (Signed)
Symptoms improved with Cipro. Awaiting final results of urine culture.

## 2011-09-30 NOTE — Assessment & Plan Note (Signed)
Recent worsening of chronic kidney disease with creatinine of 2. Patient has followed up with nephrology. Spironolactone was discontinued. We'll plan for repeat renal function in one month in our office.

## 2011-09-30 NOTE — Assessment & Plan Note (Signed)
No improvement with use of Dulcolax or fiber supplement. We'll add lactulose, 1 tablespoon every 2 hours up to 3 doses or until bowel movement. Patient will call if symptoms are not improving.

## 2011-10-01 ENCOUNTER — Telehealth: Payer: Self-pay | Admitting: Internal Medicine

## 2011-10-01 DIAGNOSIS — R300 Dysuria: Secondary | ICD-10-CM

## 2011-10-01 MED ORDER — PHENAZOPYRIDINE HCL 200 MG PO TABS: 200.0000 mg | ORAL_TABLET | Freq: Three times a day (TID) | ORAL | Status: AC | PRN

## 2011-10-01 NOTE — Telephone Encounter (Signed)
Caller: Ellin Saba; PCP: Ronna Polio; CB#: [phone]; ; ; Call regarding Abdominal Pain;   Patient was seen in office 10/01/11 and was perscribed Lactulose for Constipation. Patient took Lactulose 10/01/11 1630 and 1830 with good results. States patient had large loose brown  BM 10/01/11. States patient developed abdominal pain, onset 10/01/11 after taking Lactulose. Describes as intermittent generalized lower abodminal pain. Patient describes urinary frequency/urgency. Patient c/o burning with urination. Onset 10/01/11. Afebrile. Denies flank pain. Patient has been taking Cipro since 09/28/11 for Dx or UTI. Triage per Urinary Sx Protocol. No emergent sx identified. Care advice given per guidelines. Advised increased fluids. Call back parameters reviewed. No appts. available in Epic EHR. PATIENT TAKING CIPRO SINCE 09/28/11 FOR DX OF UTI. PATIENT DEVELOPED URINARY FREQUENCY, URGENCY, DYSURIA 10/01/11. AFEBRILE. C/O INTERMITTENT LOWER ABDOMINAL PAIN. NO APPTS. AVAILABLE. PATIENT USES [facility] ON [address] @ [phone]. PLEASE RETURN CALL TO PATIENT WITH ADDITIONAL INSTRUCTIONS/APPT. @ [phone].

## 2011-10-01 NOTE — Telephone Encounter (Signed)
Patient advised as instructed via telephone.  Rx sent to pharmacy. 

## 2011-10-01 NOTE — Telephone Encounter (Signed)
Her UTI is sensitive to cipro so she may just need a medication for the burinng, so I have called in pyridium to her pharmacy

## 2011-10-04 ENCOUNTER — Ambulatory Visit: Payer: Self-pay | Admitting: Oncology

## 2011-10-05 ENCOUNTER — Ambulatory Visit: Payer: Medicare Other

## 2011-10-06 ENCOUNTER — Ambulatory Visit: Payer: Self-pay | Admitting: Oncology

## 2011-10-06 LAB — CBC CANCER CENTER
Basophil %: 0.8 %
HCT: 30.5 % — ABNORMAL LOW (ref 35.0–47.0)
HGB: 9.9 g/dL — ABNORMAL LOW (ref 12.0–16.0)
Lymphocyte #: 1.6 x10 3/mm (ref 1.0–3.6)
Lymphocyte %: 16.6 %
MCH: 33.5 pg (ref 26.0–34.0)
Monocyte #: 1 x10 3/mm — ABNORMAL HIGH (ref 0.2–0.9)
Monocyte %: 10.5 %
Neutrophil #: 6.5 x10 3/mm (ref 1.4–6.5)
Platelet: 1458 x10 3/mm — ABNORMAL HIGH (ref 150–440)

## 2011-10-06 LAB — BASIC METABOLIC PANEL
BUN: 31 mg/dL — ABNORMAL HIGH (ref 7–18)
Calcium, Total: 8.4 mg/dL — ABNORMAL LOW (ref 8.5–10.1)
Creatinine: 1.55 mg/dL — ABNORMAL HIGH (ref 0.60–1.30)
EGFR (African American): 36 — ABNORMAL LOW
EGFR (Non-African Amer.): 31 — ABNORMAL LOW
Glucose: 128 mg/dL — ABNORMAL HIGH (ref 65–99)
Osmolality: 252 (ref 275–301)
Sodium: 121 mmol/L — ABNORMAL LOW (ref 136–145)

## 2011-10-08 ENCOUNTER — Other Ambulatory Visit: Payer: Self-pay | Admitting: *Deleted

## 2011-10-08 MED ORDER — LEVOTHYROXINE SODIUM 25 MCG PO TABS: 25.0000 ug | ORAL_TABLET | Freq: Every day | ORAL | Status: DC

## 2011-10-09 ENCOUNTER — Other Ambulatory Visit: Payer: Self-pay | Admitting: Internal Medicine

## 2011-10-11 ENCOUNTER — Other Ambulatory Visit: Payer: Self-pay | Admitting: *Deleted

## 2011-10-11 MED ORDER — METOPROLOL TARTRATE 25 MG PO TABS: 25.0000 mg | ORAL_TABLET | Freq: Every day | ORAL | Status: DC

## 2011-10-12 ENCOUNTER — Encounter: Payer: Self-pay | Admitting: Internal Medicine

## 2011-10-14 ENCOUNTER — Other Ambulatory Visit: Payer: Self-pay | Admitting: *Deleted

## 2011-10-14 MED ORDER — METOPROLOL TARTRATE 25 MG PO TABS: 25.0000 mg | ORAL_TABLET | Freq: Two times a day (BID) | ORAL | Status: DC

## 2011-10-14 MED ORDER — LEVOTHYROXINE SODIUM 50 MCG PO TABS: 50.0000 ug | ORAL_TABLET | Freq: Every day | ORAL | Status: DC

## 2011-10-18 ENCOUNTER — Encounter: Payer: Self-pay | Admitting: Neurology

## 2011-10-18 ENCOUNTER — Ambulatory Visit (INDEPENDENT_AMBULATORY_CARE_PROVIDER_SITE_OTHER): Payer: Medicare Other | Admitting: Neurology

## 2011-10-18 VITALS — BP 118/66 | HR 72 | Wt 116.5 lb

## 2011-10-18 DIAGNOSIS — Q068 Other specified congenital malformations of spinal cord: Secondary | ICD-10-CM

## 2011-10-18 DIAGNOSIS — M48061 Spinal stenosis, lumbar region without neurogenic claudication: Secondary | ICD-10-CM

## 2011-10-18 LAB — CBC CANCER CENTER
Basophil #: 0.1 x10 3/mm (ref 0.0–0.1)
Eosinophil #: 0.1 x10 3/mm (ref 0.0–0.7)
Eosinophil %: 2.9 %
HCT: 29.2 % — ABNORMAL LOW (ref 35.0–47.0)
Lymphocyte %: 21.8 %
MCH: 34.7 pg — ABNORMAL HIGH (ref 26.0–34.0)
MCHC: 32.5 g/dL (ref 32.0–36.0)
MCV: 107 fL — ABNORMAL HIGH (ref 80–100)
Monocyte #: 0.7 x10 3/mm (ref 0.2–0.9)
Monocyte %: 12.8 %
Neutrophil %: 61.5 %
Platelet: 979 x10 3/mm — ABNORMAL HIGH (ref 150–440)
RBC: 2.74 10*6/uL — ABNORMAL LOW (ref 3.80–5.20)
RDW: 26.3 % — ABNORMAL HIGH (ref 11.5–14.5)

## 2011-10-18 LAB — COMPREHENSIVE METABOLIC PANEL
Alkaline Phosphatase: 88 U/L (ref 50–136)
BUN: 35 mg/dL — ABNORMAL HIGH (ref 7–18)
Bilirubin,Total: 0.5 mg/dL (ref 0.2–1.0)
Calcium, Total: 8.8 mg/dL (ref 8.5–10.1)
Chloride: 97 mmol/L — ABNORMAL LOW (ref 98–107)
Creatinine: 1.63 mg/dL — ABNORMAL HIGH (ref 0.60–1.30)
EGFR (African American): 33 — ABNORMAL LOW
EGFR (Non-African Amer.): 29 — ABNORMAL LOW
SGOT(AST): 44 U/L — ABNORMAL HIGH (ref 15–37)
SGPT (ALT): 35 U/L
Sodium: 135 mmol/L — ABNORMAL LOW (ref 136–145)
Total Protein: 7.5 g/dL (ref 6.4–8.2)

## 2011-10-18 NOTE — Progress Notes (Signed)
Dear Dr. Dan Humphreys,  I saw  Tanya Rice back in [facility] for her problem with vertigo and possible peripheral neuropathy who also has had sudden hearing loss in the left ear.  As you may recall, she is a 78 y.o. year old female with a history of L4-L5 lumbar stenosis, MDS who has had a 4 year history of vertigo that occurs about every month and lasts for up to a day.  When I saw her I saw upbeat nystagmus as well as horizontal gaze evoked nystagmus which made me concerned for a cerebellar or brainstem lesion causing her vertigo.  I did get an MRI brain and MRA of her head and neck as a result with the MRI brain being unremarkable.  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  MRA head was also unremarkable.  I also got an MRI of her C-spine given her very brisk reflexes and wide based gait, while it did show some bilateral foraminal stenosis  particularly in the lower C-spine it only showed borderline canal stenosis at C4-C5.  EMG NCS of thelower extremities revealed a  chronic left L4 and S1 radiculopathies with no evidence of peripheral neuropathy.  Peripheral neuropathy labs were unremarkable except for an elevated ESR of 92 that I felt was likely due to her MDS.  Since I last saw her she has had an eventful course.  She developed a portal vein thrombosis and required anti-coagulation.  In addition, her MDS has resulted in platelets in excess of 1200, but has responded somewhat to Hydrea.  She also developed sudden loss of hearing in her right ear.  She has been doing better with her vertigo, but she has had one bout since she lost hearing in her ear.  She continues to take meclizine when she has the vertigo.  She continues to have a wide based gait.  A review of a lumbar spine MRI report from 05/19/2011 revealed severe lumbar stenosis at L4-L5 due to spondylosis.  Her numbness and tingling in her feet has  improved, although she still gets shooting pain down her right leg at times.   Medical history, social history, and family history were reviewed and have not changed since the last clinic visit except where noted above.  Current Outpatient Prescriptions on File Prior to Visit  Medication Sig Dispense Refill  . aspirin EC 81 MG tablet Take 81 mg by mouth daily. On hold (05/24/11)      . Calcium Carbonate-Vitamin D (CALCIUM + D PO) Take 1 tablet by mouth daily.       . fexofenadine (ALLEGRA) 180 MG tablet Take 1 tablet (180 mg total) by mouth daily.  30 tablet  6  . furosemide (LASIX) 40 MG tablet Take 1 tablet (40 mg total) by mouth daily.  30 tablet  12  . hydroxyurea (HYDREA) 500 MG capsule Take 1 capsule (500 mg total) by mouth daily. Takes 2 caps in the am, 1 cap in the pm  30 capsule  3  . lactose free nutrition (BOOST) LIQD Take 1 Container by mouth daily.      Marland Kitchen levothyroxine (SYNTHROID, LEVOTHROID) 50 MCG tablet Take 1 tablet (50 mcg total) by mouth daily.  30 tablet  6  . meclizine (ANTIVERT) 25 MG tablet Take 1 tablet (25 mg total) by mouth 3 (three) times daily as needed. For vertigo  30 tablet  0  . metoprolol  tartrate (LOPRESSOR) 25 MG tablet Take 1 tablet (25 mg total) by mouth 2 (two) times daily.  60 tablet  6  . Multiple Vitamins-Minerals (MULTIVITAMIN WITH MINERALS) tablet Take 1 tablet by mouth daily.      Marland Kitchen warfarin (COUMADIN) 2.5 MG tablet Take 2.5 mg by mouth every Monday, Wednesday, and Friday.      . warfarin (COUMADIN) 3 MG tablet Take 3 mg by mouth every Tuesday, Thursday, Saturday, and Sunday. Increase to 3 mg daily for [1] week, recheck INR/PT.      Marland Kitchen warfarin (COUMADIN) 4 MG tablet Take 1 tablet (4 mg total) by mouth daily.  30 tablet  2  . benzonatate (TESSALON) 100 MG capsule Take 100 mg by mouth 3 (three) times daily as needed. For cough      . ciprofloxacin (CIPRO) 250 MG tablet Take 1 tablet (250 mg total) by mouth 2 (two) times daily.  14 tablet  0  . diazepam  (VALIUM) 2 MG tablet Take 2 mg by mouth at bedtime.      . Eszopiclone 3 MG TABS Take 1 tablet (3 mg total) by mouth at bedtime.  30 tablet  3  . pantoprazole (PROTONIX) 40 MG tablet Take 1 tablet (40 mg total) by mouth 2 (two) times daily.  180 tablet  1  . warfarin (COUMADIN) 5 MG tablet TAKE 1 TABLET EVERY DAY  30 tablet  3    Allergies  Allergen Reactions  . Hydrocodone     Tremors  . Macrobid (Nitrofurantoin)   . Morphine And Related   . Amoxicillin-Pot Clavulanate Rash  . Sulfa Antibiotics Rash    ROS:  13 systems were reviewed and  are unremarkable.  Exam: . Filed Vitals:   10/18/11 1446 10/18/11 1537 10/18/11 1538  BP: 120/70 112/64 118/66  Pulse: 60 68 72  Weight: 116 lb 8 oz (52.844 kg)      In general, well appearing older women.  M  Cranial Nerves: Pupils are equally round and reactive to light. Visual fields full to confrontation. EOMs -> end gaze nystagmus, upbeat nystagmus in upgaze.   Facial sensation and muscles of mastication are intact. Muscles of facial expression are symmetric.Hearing ++ decreased to bilateral finger rub. Tongue protrusion, uvula, palate midline.  Shoulder shrug intact  Motor:  Normal bulk and tone, no drift and 5/5 muscle strength bilaterally.  Reflexes:  3+ thoughout, except 1+ ankles, toes down.  Coordination:  Normal finger to nose  Gait:  Wide based gait and station.  - Romberg  Vest: - difficult to do a head thrust given patients neck immobility  Impression/Recommendations:  1.  Vertigo/Sudden hearing loss - I suspect that these are related.  Sudden unilateral hearing loss has a wide differential.  Certainly, an AICA infarct could cause this, but one would expect other signs as well.  She is at risk of infarction given her increased platelets too.  Also, prolonged conditions such as Meniere's disease can cause permanent hearing loss, but during her vertigo episodes she did not experience the tinnitus or transient hearing loss.   Other possibilities are viral.  I am still concerned about why she has upbeat nystagmus, which is generally a central sign.  However, her unremarkable MRI makes a concerning etiology, such as neoplasm much less likely.  It is possible that her vertigo will improve somewhat if we conclude that the hearing loss was the terminal event for an ongoing process that was causing the vertigo.  However, it there vertigo  continues then she is going to call me and we will set her up for vestibular rehab. 2.  Burning in legs/gait instability - I think her gait instability is likely from her severe lumbar stenosis.  Her burning has remitted, but it is possible that this is from the stenosis as well although would be less typical.  She also gets shooting pain down her legs, is likely from the stenosis.  However, I understand she is ready to see orthopedics about her back.  She may need surgery if her walking continues to get worse.  Of course, she is not a great surgical candidate.   Lupita Raider Modesto Charon, MD [facility], [place]

## 2011-10-19 ENCOUNTER — Telehealth: Payer: Self-pay | Admitting: Internal Medicine

## 2011-10-19 NOTE — Telephone Encounter (Signed)
Patient saw Dr. Lonn Georgia P.A at [facility] he recommended that she have a lumbar epidural with Dr. Yves Dill ,but she needs permission to be off her coumadin for 5 days from her primary care physician.

## 2011-10-19 NOTE — Telephone Encounter (Signed)
I think that this would be extremely high risk for her and I would recommend against the epidural injection.

## 2011-10-20 DIAGNOSIS — M48061 Spinal stenosis, lumbar region without neurogenic claudication: Secondary | ICD-10-CM | POA: Insufficient documentation

## 2011-10-20 NOTE — Telephone Encounter (Signed)
Patient advised as instructed via telephone. 

## 2011-10-25 ENCOUNTER — Encounter: Payer: Self-pay | Admitting: Otolaryngology

## 2011-11-04 ENCOUNTER — Ambulatory Visit: Payer: Self-pay | Admitting: Oncology

## 2011-11-04 ENCOUNTER — Encounter: Payer: Self-pay | Admitting: Otolaryngology

## 2011-11-05 ENCOUNTER — Ambulatory Visit (INDEPENDENT_AMBULATORY_CARE_PROVIDER_SITE_OTHER): Payer: Medicare Other | Admitting: Internal Medicine

## 2011-11-05 ENCOUNTER — Encounter: Payer: Self-pay | Admitting: Internal Medicine

## 2011-11-05 VITALS — BP 120/62 | HR 60 | Temp 98.7°F | Ht 61.0 in | Wt 121.0 lb

## 2011-11-05 DIAGNOSIS — Z7901 Long term (current) use of anticoagulants: Secondary | ICD-10-CM

## 2011-11-05 DIAGNOSIS — H9190 Unspecified hearing loss, unspecified ear: Secondary | ICD-10-CM

## 2011-11-05 DIAGNOSIS — H9192 Unspecified hearing loss, left ear: Secondary | ICD-10-CM

## 2011-11-05 DIAGNOSIS — M25551 Pain in right hip: Secondary | ICD-10-CM

## 2011-11-05 DIAGNOSIS — I81 Portal vein thrombosis: Secondary | ICD-10-CM

## 2011-11-05 DIAGNOSIS — M25559 Pain in unspecified hip: Secondary | ICD-10-CM

## 2011-11-05 DIAGNOSIS — G4700 Insomnia, unspecified: Secondary | ICD-10-CM

## 2011-11-05 DIAGNOSIS — E538 Deficiency of other specified B group vitamins: Secondary | ICD-10-CM

## 2011-11-05 LAB — PROTIME-INR: INR: 2.86 — ABNORMAL HIGH (ref <1.50)

## 2011-11-05 MED ORDER — CYANOCOBALAMIN 1000 MCG/ML IJ SOLN
1000.0000 ug | Freq: Once | INTRAMUSCULAR | Status: AC
  Administered 2011-11-05: 1000 ug via INTRAMUSCULAR

## 2011-11-06 LAB — COMPREHENSIVE METABOLIC PANEL
BUN: 34 mg/dL — ABNORMAL HIGH (ref 6–23)
CO2: 26 mEq/L (ref 19–32)
Creat: 1.55 mg/dL — ABNORMAL HIGH (ref 0.50–1.10)
Glucose, Bld: 120 mg/dL — ABNORMAL HIGH (ref 70–99)
Sodium: 133 mEq/L — ABNORMAL LOW (ref 135–145)
Total Bilirubin: 0.5 mg/dL (ref 0.3–1.2)
Total Protein: 7 g/dL (ref 6.0–8.3)

## 2011-11-06 NOTE — Assessment & Plan Note (Signed)
INR today is therapeutic. Patient reports she was also started back on aspirin by her hematologist. Letter sent to her hematologist asking per guidelines as to length of Coumadin therapy after her portal vein thrombosis. She has significant risk for bleeding given history of gastric hemorrhage.

## 2011-11-06 NOTE — Assessment & Plan Note (Signed)
Last lab work from June 2013 showed significant declining kidney function. In the interim, patient was seen by her nephrologist but I do not have records of that visit at this time. Will request them. Repeat renal function today is much improved with creatinine of 1.5. We'll continue to monitor. We'll continue current medications.

## 2011-11-06 NOTE — Progress Notes (Signed)
Subjective:    Patient ID: Tanya Rice, female    DOB: 06-30-1926, 78 y.o.   MRN: [id number]  HPI 78 year old female with a history of essential thrombocythemia, myelodysplasia, gastric ulcer with hemorrhage, portal vein thrombosis, chronic right hip and low back pain presents for followup. Overall, she reports she has been doing well. She continues to have some right hip and lower back pain. She decided not to proceed with epidural steroid injection because of significant risk given her history. She is currently using Tylenol with some improvement in her symptoms.  In regards to essential from ischemia, she notes she is still on hydroxyurea in the recent platelet count was near 900. She follows with hematology on a regular basis.  In regards to chronic kidney disease, she reports she was seen by her nephrologist about a month ago and he reported that her renal function was stable. This is in contrast to last we performed in June 2013 which showed significant decline in renal function. Notes from her visit with nephrology are not available.  In regards to left sided hearing loss, she reports she was fitted for hearing aids and has had some improvement in her hearing.  Outpatient Encounter Prescriptions as of 76/05/2011  Medication Sig Dispense Refill  . aspirin EC 81 MG tablet Take 81 mg by mouth daily. On hold (05/24/11)      . Calcium Carbonate-Vitamin D (CALCIUM + D PO) Take 1 tablet by mouth daily.       . diazepam (VALIUM) 2 MG tablet Take 2 mg by mouth at bedtime.      . Eszopiclone 3 MG TABS Take 1 tablet (3 mg total) by mouth at bedtime.  30 tablet  3  . fexofenadine (ALLEGRA) 180 MG tablet Take 1 tablet (180 mg total) by mouth daily.  30 tablet  6  . furosemide (LASIX) 40 MG tablet Take 1 tablet (40 mg total) by mouth daily.  30 tablet  12  . hydroxyurea (HYDREA) 500 MG capsule Take 1 capsule (500 mg total) by mouth daily. Takes 2 caps in the am, 1 cap in the pm  30 capsule  3  .  lactose free nutrition (BOOST) LIQD Take 1 Container by mouth daily.      Marland Kitchen levothyroxine (SYNTHROID, LEVOTHROID) 50 MCG tablet Take 1 tablet (50 mcg total) by mouth daily.  30 tablet  6  . meclizine (ANTIVERT) 25 MG tablet Take 1 tablet (25 mg total) by mouth 3 (three) times daily as needed. For vertigo  30 tablet  0  . metoprolol tartrate (LOPRESSOR) 25 MG tablet Take 1 tablet (25 mg total) by mouth 2 (two) times daily.  60 tablet  6  . Multiple Vitamins-Minerals (MULTIVITAMIN WITH MINERALS) tablet Take 1 tablet by mouth daily.      . pantoprazole (PROTONIX) 40 MG tablet Take 1 tablet (40 mg total) by mouth 2 (two) times daily.  180 tablet  1  . warfarin (COUMADIN) 5 MG tablet TAKE 1 TABLET EVERY DAY  30 tablet  3   Facility-Administered Encounter Medications as of 76/05/2011  Medication Dose Route Frequency Provider Last Rate Last Dose  . cyanocobalamin ((VITAMIN B-12)) injection 1,000 mcg  1,000 mcg Intramuscular Once Shelia Media, MD   1,000 mcg at 11/05/11 1626   BP 120/62  Pulse 60  Temp 98.7 F (37.1 C) (Oral)  Ht 5\' 1"  (1.549 m)  Wt 121 lb (54.885 kg)  BMI 22.86 kg/m2  SpO2 96%  Review of Systems  Constitutional: Negative for fever, chills, appetite change, fatigue and unexpected weight change.  HENT: Negative for ear pain, congestion, sore throat, trouble swallowing, neck pain, voice change and sinus pressure.   Eyes: Negative for visual disturbance.  Respiratory: Negative for cough, shortness of breath, wheezing and stridor.   Cardiovascular: Negative for chest pain, palpitations and leg swelling.  Gastrointestinal: Negative for nausea, vomiting, abdominal pain, diarrhea, constipation, blood in stool, abdominal distention and anal bleeding.  Genitourinary: Negative for dysuria and flank pain.  Musculoskeletal: Positive for myalgias, back pain and arthralgias. Negative for gait problem.  Skin: Negative for color change and rash.  Neurological: Negative for dizziness and  headaches.  Hematological: Negative for adenopathy. Does not bruise/bleed easily.  Psychiatric/Behavioral: Positive for disturbed wake/sleep cycle. Negative for suicidal ideas and dysphoric mood. The patient is not nervous/anxious.        Objective:   Physical Exam  Constitutional: She is oriented to person, place, and time. She appears well-developed and well-nourished. No distress.  HENT:  Head: Normocephalic and atraumatic.  Right Ear: External ear normal.  Left Ear: External ear normal.  Nose: Nose normal.  Mouth/Throat: Oropharynx is clear and moist. No oropharyngeal exudate.  Eyes: Conjunctivae are normal. Pupils are equal, round, and reactive to light. Right eye exhibits no discharge. Left eye exhibits no discharge. No scleral icterus.  Neck: Normal range of motion. Neck supple. No tracheal deviation present. No thyromegaly present.  Cardiovascular: Normal rate, regular rhythm, normal heart sounds and intact distal pulses.  Exam reveals no gallop and no friction rub.   No murmur heard. Pulmonary/Chest: Effort normal and breath sounds normal. No respiratory distress. She has no wheezes. She has no rales. She exhibits no tenderness.  Musculoskeletal: Normal range of motion. She exhibits no edema and no tenderness.  Lymphadenopathy:    She has no cervical adenopathy.  Neurological: She is alert and oriented to person, place, and time. No cranial nerve deficit. She exhibits normal muscle tone. Coordination normal.  Skin: Skin is warm and dry. No rash noted. She is not diaphoretic. No erythema. No pallor.  Psychiatric: She has a normal mood and affect. Her behavior is normal. Judgment and thought content normal.          Assessment & Plan:

## 2011-11-06 NOTE — Assessment & Plan Note (Signed)
History of portal vein thrombosis. Question optimal length of anticoagulation with Coumadin. She has significant risks for bleeding. Letter sent hematologist today asking for guidelines as to length of Coumadin therapy.

## 2011-11-06 NOTE — Assessment & Plan Note (Signed)
B12 shot today. 

## 2011-11-06 NOTE — Assessment & Plan Note (Signed)
Improved with the addition of hearing aids. Will continue to monitor.

## 2011-11-06 NOTE — Assessment & Plan Note (Signed)
Persistent pain in right hip joint. Patient is followed by orthopedics. Recommended against epidural steroid injection because of ongoing issues with platelet dysfunction. Patient is also not a good candidate for oral or injected steroids because of history of gastric ulcer and hemorrhage after steroid exposure. Will continue Tylenol as needed.

## 2011-11-06 NOTE — Assessment & Plan Note (Signed)
Improved with use of Lunesta. We'll continue.

## 2011-11-09 ENCOUNTER — Other Ambulatory Visit: Payer: Self-pay | Admitting: *Deleted

## 2011-11-09 MED ORDER — WARFARIN SODIUM 5 MG PO TABS: 5.0000 mg | ORAL_TABLET | Freq: Every day | ORAL | Status: DC

## 2011-11-15 ENCOUNTER — Ambulatory Visit: Payer: Self-pay | Admitting: Oncology

## 2011-11-15 LAB — CBC CANCER CENTER
Basophil #: 0 x10 3/mm (ref 0.0–0.1)
Basophil %: 0.4 %
Eosinophil #: 0.1 x10 3/mm (ref 0.0–0.7)
HCT: 28 % — ABNORMAL LOW (ref 35.0–47.0)
HGB: 9.6 g/dL — ABNORMAL LOW (ref 12.0–16.0)
Lymphocyte #: 1 x10 3/mm (ref 1.0–3.6)
Lymphocyte %: 26 %
Monocyte %: 10.5 %
Neutrophil #: 2.2 x10 3/mm (ref 1.4–6.5)
Neutrophil %: 60.1 %
RDW: 25.9 % — ABNORMAL HIGH (ref 11.5–14.5)
WBC: 3.7 x10 3/mm (ref 3.6–11.0)

## 2011-11-15 LAB — COMPREHENSIVE METABOLIC PANEL
Alkaline Phosphatase: 87 U/L (ref 50–136)
Anion Gap: 5 — ABNORMAL LOW (ref 7–16)
BUN: 38 mg/dL — ABNORMAL HIGH (ref 7–18)
Calcium, Total: 8.9 mg/dL (ref 8.5–10.1)
Chloride: 98 mmol/L (ref 98–107)
Co2: 30 mmol/L (ref 21–32)
Creatinine: 1.83 mg/dL — ABNORMAL HIGH (ref 0.60–1.30)
EGFR (African American): 29 — ABNORMAL LOW
EGFR (Non-African Amer.): 25 — ABNORMAL LOW
Glucose: 87 mg/dL (ref 65–99)
Potassium: 4.1 mmol/L (ref 3.5–5.1)
SGOT(AST): 37 U/L (ref 15–37)
SGPT (ALT): 31 U/L (ref 12–78)
Total Protein: 7.6 g/dL (ref 6.4–8.2)

## 2011-12-05 ENCOUNTER — Ambulatory Visit: Payer: Self-pay | Admitting: Oncology

## 2011-12-13 LAB — COMPREHENSIVE METABOLIC PANEL
Albumin: 3.6 g/dL (ref 3.4–5.0)
Alkaline Phosphatase: 91 U/L (ref 50–136)
Anion Gap: 7 (ref 7–16)
BUN: 26 mg/dL — ABNORMAL HIGH (ref 7–18)
Bilirubin,Total: 0.5 mg/dL (ref 0.2–1.0)
Calcium, Total: 8.7 mg/dL (ref 8.5–10.1)
Chloride: 100 mmol/L (ref 98–107)
Co2: 29 mmol/L (ref 21–32)
Creatinine: 1.54 mg/dL — ABNORMAL HIGH (ref 0.60–1.30)
EGFR (African American): 36 — ABNORMAL LOW
EGFR (Non-African Amer.): 31 — ABNORMAL LOW
Glucose: 86 mg/dL (ref 65–99)
Osmolality: 276 (ref 275–301)
Potassium: 4.5 mmol/L (ref 3.5–5.1)
SGOT(AST): 50 U/L — ABNORMAL HIGH (ref 15–37)
SGPT (ALT): 41 U/L (ref 12–78)
Sodium: 136 mmol/L (ref 136–145)
Total Protein: 7.8 g/dL (ref 6.4–8.2)

## 2011-12-13 LAB — CBC CANCER CENTER
Basophil #: 0 x10 3/mm (ref 0.0–0.1)
Basophil %: 0.3 %
Eosinophil #: 0.1 x10 3/mm (ref 0.0–0.7)
Eosinophil %: 2.1 %
HCT: 27.9 % — ABNORMAL LOW (ref 35.0–47.0)
HGB: 9.4 g/dL — ABNORMAL LOW (ref 12.0–16.0)
Lymphocyte #: 0.9 x10 3/mm — ABNORMAL LOW (ref 1.0–3.6)
Lymphocyte %: 27.2 %
MCH: 39.1 pg — ABNORMAL HIGH (ref 26.0–34.0)
MCHC: 33.5 g/dL (ref 32.0–36.0)
MCV: 117 fL — ABNORMAL HIGH (ref 80–100)
Monocyte #: 0.4 x10 3/mm (ref 0.2–0.9)
Monocyte %: 12.6 %
Neutrophil #: 1.9 x10 3/mm (ref 1.4–6.5)
Neutrophil %: 57.8 %
Platelet: 497 x10 3/mm — ABNORMAL HIGH (ref 150–440)
RBC: 2.39 10*6/uL — ABNORMAL LOW (ref 3.80–5.20)
RDW: 27.8 % — ABNORMAL HIGH (ref 11.5–14.5)
WBC: 3.3 x10 3/mm — ABNORMAL LOW (ref 3.6–11.0)

## 2011-12-16 ENCOUNTER — Encounter: Payer: Self-pay | Admitting: Otolaryngology

## 2011-12-22 ENCOUNTER — Ambulatory Visit (INDEPENDENT_AMBULATORY_CARE_PROVIDER_SITE_OTHER): Payer: Medicare Other | Admitting: Internal Medicine

## 2011-12-22 ENCOUNTER — Encounter: Payer: Self-pay | Admitting: Internal Medicine

## 2011-12-22 VITALS — BP 130/78 | HR 76 | Temp 98.6°F | Ht 61.0 in | Wt 120.8 lb

## 2011-12-22 DIAGNOSIS — Z1239 Encounter for other screening for malignant neoplasm of breast: Secondary | ICD-10-CM

## 2011-12-22 DIAGNOSIS — Z7901 Long term (current) use of anticoagulants: Secondary | ICD-10-CM

## 2011-12-22 DIAGNOSIS — Z23 Encounter for immunization: Secondary | ICD-10-CM

## 2011-12-22 DIAGNOSIS — D473 Essential (hemorrhagic) thrombocythemia: Secondary | ICD-10-CM

## 2011-12-22 DIAGNOSIS — R935 Abnormal findings on diagnostic imaging of other abdominal regions, including retroperitoneum: Secondary | ICD-10-CM

## 2011-12-22 NOTE — Assessment & Plan Note (Signed)
Patient has been treated with Coumadin because of history of portal vein thrombosis. Discussed with her hematologist. He has recommended that we discontinue Coumadin at this time. We'll continue aspirin. Will continue to monitor.

## 2011-12-22 NOTE — Assessment & Plan Note (Signed)
Patient reports that recent platelet count was normal on hydroxyurea. Will request records from her hematologist.

## 2011-12-22 NOTE — Progress Notes (Signed)
Subjective:    Patient ID: Tanya Rice, female    DOB: 07-15-1926, 78 y.o.   MRN: [id number]  HPI 78 year old female with history of essential thrombocythemia, hypothyroidism, hypertension presents for followup. She reports she is generally feeling well. Energy level is much improved compared to previous. She reports that recent platelet count drawn by her hematologist was normal. She is currently taking hydroxyurea. She also notes that her hematologist has recommended that she discontinue warfarin and continue on aspirin only on Monday Wednesdays and Fridays. She reports that he has scheduled a followup MRI of her abdomen to evaluate a cystic area that was seen on previous CT of the abdomen in February 2013. She denies any abdominal pain, change in bowel habits, change in appetite. She reports her energy level has been. She denies any new concerns today.  Outpatient Encounter Prescriptions as of 12/22/2011  Medication Sig Dispense Refill  . aspirin EC 81 MG tablet Take on Monday, Wednesday, and Friday only      . Calcium Carbonate-Vitamin D (CALCIUM + D PO) Take 1 tablet by mouth daily.       . diazepam (VALIUM) 2 MG tablet Take 2 mg by mouth at bedtime.      . Eszopiclone 3 MG TABS Take 1 tablet (3 mg total) by mouth at bedtime.  30 tablet  3  . fexofenadine (ALLEGRA) 180 MG tablet Take 1 tablet (180 mg total) by mouth daily.  30 tablet  6  . hydroxyurea (HYDREA) 500 MG capsule Take 1 capsule (500 mg total) by mouth daily. Takes 2 caps in the am, 1 cap in the pm  30 capsule  3  . lactose free nutrition (BOOST) LIQD Take 1 Container by mouth daily.      Marland Kitchen levothyroxine (SYNTHROID, LEVOTHROID) 50 MCG tablet Take 1 tablet (50 mcg total) by mouth daily.  30 tablet  6  . meclizine (ANTIVERT) 25 MG tablet Take 1 tablet (25 mg total) by mouth 3 (three) times daily as needed. For vertigo  30 tablet  0  . metoprolol tartrate (LOPRESSOR) 25 MG tablet Take 1 tablet (25 mg total) by mouth 2 (two) times  daily.  60 tablet  6  . Multiple Vitamins-Minerals (MULTIVITAMIN WITH MINERALS) tablet Take 1 tablet by mouth daily.      . pantoprazole (PROTONIX) 40 MG tablet Take 1 tablet (40 mg total) by mouth 2 (two) times daily.  180 tablet  1  . DISCONTD: warfarin (COUMADIN) 5 MG tablet Take 1 tablet (5 mg total) by mouth daily.  30 tablet  6  . furosemide (LASIX) 40 MG tablet Take 1 tablet (40 mg total) by mouth daily.  30 tablet  12   BP 130/78  Pulse 76  Temp 98.6 F (37 C) (Oral)  Ht 5\' 1"  (1.549 m)  Wt 120 lb 12 oz (54.772 kg)  BMI 22.82 kg/m2  SpO2 96%  Review of Systems  Constitutional: Negative for fever, chills, appetite change, fatigue and unexpected weight change.  HENT: Negative for ear pain, congestion, sore throat, trouble swallowing, neck pain, voice change and sinus pressure.   Eyes: Negative for visual disturbance.  Respiratory: Negative for cough, shortness of breath, wheezing and stridor.   Cardiovascular: Negative for chest pain, palpitations and leg swelling.  Gastrointestinal: Negative for nausea, vomiting, abdominal pain, diarrhea, constipation, blood in stool, abdominal distention and anal bleeding.  Genitourinary: Negative for dysuria and flank pain.  Musculoskeletal: Negative for myalgias, arthralgias and gait problem.  Skin: Negative  for color change and rash.  Neurological: Negative for dizziness and headaches.  Hematological: Negative for adenopathy. Does not bruise/bleed easily.  Psychiatric/Behavioral: Negative for suicidal ideas, disturbed wake/sleep cycle and dysphoric mood. The patient is not nervous/anxious.        Objective:   Physical Exam  Constitutional: She is oriented to person, place, and time. She appears well-developed and well-nourished. No distress.  HENT:  Head: Normocephalic and atraumatic.  Right Ear: External ear normal.  Left Ear: External ear normal.  Nose: Nose normal.  Mouth/Throat: Oropharynx is clear and moist. No oropharyngeal  exudate.  Eyes: Conjunctivae normal are normal. Pupils are equal, round, and reactive to light. Right eye exhibits no discharge. Left eye exhibits no discharge. No scleral icterus.  Neck: Normal range of motion. Neck supple. No tracheal deviation present. No thyromegaly present.  Cardiovascular: Normal rate, regular rhythm, normal heart sounds and intact distal pulses.  Exam reveals no gallop and no friction rub.   No murmur heard. Pulmonary/Chest: Effort normal and breath sounds normal. No respiratory distress. She has no wheezes. She has no rales. She exhibits no tenderness.  Musculoskeletal: Normal range of motion. She exhibits no edema and no tenderness.  Lymphadenopathy:    She has no cervical adenopathy.  Neurological: She is alert and oriented to person, place, and time. No cranial nerve deficit. She exhibits normal muscle tone. Coordination normal.  Skin: Skin is warm and dry. No rash noted. She is not diaphoretic. No erythema. No pallor.  Psychiatric: She has a normal mood and affect. Her behavior is normal. Judgment and thought content normal.          Assessment & Plan:

## 2011-12-22 NOTE — Assessment & Plan Note (Signed)
Patient was noted to have cystic area near her inferior vena cava on CT of the abdomen in February 2013. She notes that she is scheduled for MRI of the abdomen for followup evaluation this month.

## 2012-01-03 ENCOUNTER — Other Ambulatory Visit: Payer: Self-pay | Admitting: Internal Medicine

## 2012-01-04 ENCOUNTER — Encounter: Payer: Self-pay | Admitting: Otolaryngology

## 2012-01-04 ENCOUNTER — Ambulatory Visit: Payer: Self-pay | Admitting: Oncology

## 2012-01-07 ENCOUNTER — Ambulatory Visit: Payer: Self-pay | Admitting: Internal Medicine

## 2012-01-10 ENCOUNTER — Ambulatory Visit: Payer: Self-pay | Admitting: Oncology

## 2012-01-17 ENCOUNTER — Encounter: Payer: Self-pay | Admitting: Internal Medicine

## 2012-02-04 ENCOUNTER — Ambulatory Visit: Payer: Self-pay | Admitting: Oncology

## 2012-02-07 ENCOUNTER — Ambulatory Visit: Payer: Self-pay | Admitting: Oncology

## 2012-02-07 LAB — COMPREHENSIVE METABOLIC PANEL
Albumin: 3.7 g/dL (ref 3.4–5.0)
Alkaline Phosphatase: 87 U/L (ref 50–136)
Bilirubin,Total: 0.4 mg/dL (ref 0.2–1.0)
Calcium, Total: 9 mg/dL (ref 8.5–10.1)
Co2: 25 mmol/L (ref 21–32)
Creatinine: 1.38 mg/dL — ABNORMAL HIGH (ref 0.60–1.30)
EGFR (Non-African Amer.): 35 — ABNORMAL LOW
Osmolality: 275 (ref 275–301)
SGPT (ALT): 29 U/L (ref 12–78)
Sodium: 135 mmol/L — ABNORMAL LOW (ref 136–145)
Total Protein: 7.9 g/dL (ref 6.4–8.2)

## 2012-02-07 LAB — CBC CANCER CENTER
Basophil #: 0 x10 3/mm (ref 0.0–0.1)
Eosinophil #: 0.1 x10 3/mm (ref 0.0–0.7)
Lymphocyte #: 0.8 x10 3/mm — ABNORMAL LOW (ref 1.0–3.6)
Lymphocyte %: 21 %
MCH: 41.6 pg — ABNORMAL HIGH (ref 26.0–34.0)
MCHC: 32.1 g/dL (ref 32.0–36.0)
Monocyte #: 0.4 x10 3/mm (ref 0.2–0.9)
Neutrophil %: 66.8 %
Platelet: 413 x10 3/mm (ref 150–440)
RBC: 2.48 10*6/uL — ABNORMAL LOW (ref 3.80–5.20)
RDW: 15 % — ABNORMAL HIGH (ref 11.5–14.5)
WBC: 4 x10 3/mm (ref 3.6–11.0)

## 2012-02-10 ENCOUNTER — Encounter: Payer: Self-pay | Admitting: Otolaryngology

## 2012-02-17 ENCOUNTER — Telehealth: Payer: Self-pay | Admitting: Internal Medicine

## 2012-02-17 ENCOUNTER — Ambulatory Visit (INDEPENDENT_AMBULATORY_CARE_PROVIDER_SITE_OTHER): Payer: Medicare Other | Admitting: *Deleted

## 2012-02-17 DIAGNOSIS — E538 Deficiency of other specified B group vitamins: Secondary | ICD-10-CM

## 2012-02-17 MED ORDER — CYANOCOBALAMIN 1000 MCG/ML IJ SOLN
1000.0000 ug | Freq: Once | INTRAMUSCULAR | Status: AC
  Administered 2012-02-17: 1000 ug via INTRAMUSCULAR

## 2012-02-17 NOTE — Telephone Encounter (Signed)
Patient is aware of appointment with [facility] on 11.15.13 @ 9:45.

## 2012-02-28 ENCOUNTER — Telehealth: Payer: Self-pay | Admitting: Internal Medicine

## 2012-02-28 NOTE — Telephone Encounter (Signed)
She needs to be seen by the dermatologist that performed her biopsy, as this is the site that is infected. (Please correct me if that is not true)

## 2012-02-28 NOTE — Telephone Encounter (Signed)
Pt called wanting to speak with a nurse.  Sent her to triage. Pt stated dr walker had sent her to wound care for her leg.  It is swelling.  And can not get appointment with wound care till wed

## 2012-02-28 NOTE — Telephone Encounter (Signed)
Pt notified that she needs to contact the last provider who treated the spot on her leg.

## 2012-02-28 NOTE — Telephone Encounter (Signed)
Pt called back states that you sent her to Wound Center. Has been almost 2 weeks and it is not any better. Her Foot and ankle is swollen. Pharmacy told Pt on Saturday that she needs an antibiotic by mouth. Pt uses [facility]. Call pt back at [phone].

## 2012-03-05 ENCOUNTER — Ambulatory Visit: Payer: Self-pay | Admitting: Oncology

## 2012-03-10 ENCOUNTER — Telehealth: Payer: Self-pay | Admitting: Internal Medicine

## 2012-03-10 NOTE — Telephone Encounter (Signed)
Let me make sure I am understanding this correctly: 1. Pt has a leg wound after having an area biopsied by dermatology 2. Dermatology is unable to help evaluate or treat this wound further If yes, then please set up follow up here for me to evaluate the wound and then set up wound care.

## 2012-03-10 NOTE — Telephone Encounter (Signed)
Has she seen dermatologist?

## 2012-03-10 NOTE — Telephone Encounter (Signed)
The wound on the patient's leg is not getting any better and now she is having pain . Wants to have a sooner appointment or be referred to the wound center.

## 2012-03-10 NOTE — Telephone Encounter (Signed)
Pt stated she did see the dermatologist. At her last appt they notified her she should see you sooner than 12/18 due to their inability to help her further.

## 2012-03-13 NOTE — Telephone Encounter (Signed)
Needs an appt per Dr. Dan Humphreys ASAP to evaluate wound on leg.

## 2012-03-15 ENCOUNTER — Ambulatory Visit (INDEPENDENT_AMBULATORY_CARE_PROVIDER_SITE_OTHER): Payer: Medicare Other | Admitting: Internal Medicine

## 2012-03-15 ENCOUNTER — Encounter: Payer: Self-pay | Admitting: Internal Medicine

## 2012-03-15 VITALS — BP 150/88 | HR 74 | Temp 97.9°F | Resp 16 | Wt 121.5 lb

## 2012-03-15 DIAGNOSIS — R600 Localized edema: Secondary | ICD-10-CM | POA: Insufficient documentation

## 2012-03-15 DIAGNOSIS — D510 Vitamin B12 deficiency anemia due to intrinsic factor deficiency: Secondary | ICD-10-CM

## 2012-03-15 DIAGNOSIS — R609 Edema, unspecified: Secondary | ICD-10-CM

## 2012-03-15 DIAGNOSIS — E538 Deficiency of other specified B group vitamins: Secondary | ICD-10-CM

## 2012-03-15 DIAGNOSIS — S81009A Unspecified open wound, unspecified knee, initial encounter: Secondary | ICD-10-CM

## 2012-03-15 DIAGNOSIS — S81801A Unspecified open wound, right lower leg, initial encounter: Secondary | ICD-10-CM

## 2012-03-15 MED ORDER — CYANOCOBALAMIN 1000 MCG/ML IJ SOLN
1000.0000 ug | Freq: Once | INTRAMUSCULAR | Status: AC
  Administered 2012-03-15: 1000 ug via INTRAMUSCULAR

## 2012-03-15 NOTE — Assessment & Plan Note (Signed)
Findings are concerning for DVT. Will set up ultrasound of the right lower extremity. If negative for DVT, we'll plan to placeUNNA boot for compression.

## 2012-03-15 NOTE — Assessment & Plan Note (Signed)
Open wound right lower extremity at site of previous biopsy. We'll first address swelling evaluate for DVT with [place] RLE. Will set up evaluation at the wound healing Center.

## 2012-03-15 NOTE — Progress Notes (Signed)
Subjective:    Patient ID: Tanya Rice, female    DOB: 1927/01/31, 78 y.o.   MRN: [id number]  HPI 78 year old female with history of essential thrombocythemia, chronic kidney disease, hypertension, and right lower extremity leg wound presents for acute visit. She reports that ever since having area on her right lower extremity biopsied she has had a wound at the site. She was seen by her dermatologist who felt that the wound is healing well. She is concerned about several weeks of yellow crusting over the wound. She is also concerned because she has developed extensive swelling in her right lower extremity. She also reports some tenderness over her calf. She is having difficulty functioning because of pain. She has been wearing a compression stocking with no improvement. She has not taken anything for pain.  Outpatient Encounter Prescriptions as of 03/15/2012  Medication Sig Dispense Refill  . aspirin EC 81 MG tablet Take on Monday, Wednesday, and Friday only      . Calcium Carbonate-Vitamin D (CALCIUM + D PO) Take 1 tablet by mouth daily.       . diazepam (VALIUM) 2 MG tablet Take 2 mg by mouth at bedtime.      . Eszopiclone 3 MG TABS Take 1 tablet (3 mg total) by mouth at bedtime.  30 tablet  3  . fexofenadine (ALLEGRA) 180 MG tablet Take 1 tablet (180 mg total) by mouth daily.  30 tablet  6  . furosemide (LASIX) 40 MG tablet Take 1 tablet (40 mg total) by mouth daily.  30 tablet  12  . hydroxyurea (HYDREA) 500 MG capsule Take 1 capsule (500 mg total) by mouth daily. Takes 2 caps in the am, 1 cap in the pm  30 capsule  3  . lactose free nutrition (BOOST) LIQD Take 1 Container by mouth daily.      Marland Kitchen levothyroxine (SYNTHROID, LEVOTHROID) 50 MCG tablet Take 1 tablet (50 mcg total) by mouth daily.  30 tablet  6  . meclizine (ANTIVERT) 25 MG tablet Take 1 tablet (25 mg total) by mouth 3 (three) times daily as needed. For vertigo  30 tablet  0  . metoprolol tartrate (LOPRESSOR) 25 MG tablet Take  1 tablet (25 mg total) by mouth 2 (two) times daily.  60 tablet  6  . Multiple Vitamins-Minerals (MULTIVITAMIN WITH MINERALS) tablet Take 1 tablet by mouth daily.      . pantoprazole (PROTONIX) 40 MG tablet TAKE ONE TABLET TWICE A DAY  180 tablet  3  . temazepam (RESTORIL) 15 MG capsule        Facility-Administered Encounter Medications as of 03/15/2012  Medication Dose Route Frequency Provider Last Rate Last Dose  . [COMPLETED] cyanocobalamin ((VITAMIN B-12)) injection 1,000 mcg  1,000 mcg Intramuscular Once Shelia Media, MD   1,000 mcg at 03/15/12 1154   BP 150/88  Pulse 74  Temp 97.9 F (36.6 C) (Oral)  Resp 16  Wt 121 lb 8 oz (55.112 kg)  SpO2 97%  Review of Systems  Constitutional: Negative for fever, chills, appetite change, fatigue and unexpected weight change.  HENT: Negative for ear pain, congestion, sore throat, trouble swallowing, neck pain, voice change and sinus pressure.   Eyes: Negative for visual disturbance.  Respiratory: Negative for cough, chest tightness, shortness of breath, wheezing and stridor.   Cardiovascular: Positive for leg swelling. Negative for chest pain and palpitations.  Gastrointestinal: Negative for nausea, vomiting, abdominal pain, diarrhea, constipation, blood in stool, abdominal distention and anal bleeding.  Genitourinary: Negative for dysuria and flank pain.  Musculoskeletal: Negative for myalgias, arthralgias and gait problem.  Skin: Positive for wound. Negative for color change and rash.  Neurological: Negative for dizziness and headaches.  Hematological: Negative for adenopathy. Does not bruise/bleed easily.  Psychiatric/Behavioral: Negative for suicidal ideas, sleep disturbance and dysphoric mood. The patient is not nervous/anxious.        Objective:   Physical Exam  Constitutional: She is oriented to person, place, and time. She appears well-developed and well-nourished. No distress.  HENT:  Head: Normocephalic and atraumatic.    Right Ear: External ear normal.  Left Ear: External ear normal.  Nose: Nose normal.  Mouth/Throat: Oropharynx is clear and moist. No oropharyngeal exudate.  Eyes: Conjunctivae normal are normal. Pupils are equal, round, and reactive to light. Right eye exhibits no discharge. Left eye exhibits no discharge. No scleral icterus.  Neck: Normal range of motion. Neck supple. No tracheal deviation present. No thyromegaly present.  Pulmonary/Chest: Effort normal.  Musculoskeletal: Normal range of motion. She exhibits no edema and no tenderness.       Right lower leg: She exhibits tenderness and edema.       Legs: Lymphadenopathy:    She has no cervical adenopathy.  Neurological: She is alert and oriented to person, place, and time. No cranial nerve deficit. She exhibits normal muscle tone. Coordination normal.  Skin: Skin is warm and dry. No rash noted. She is not diaphoretic. No erythema. No pallor.  Psychiatric: She has a normal mood and affect. Her behavior is normal. Judgment and thought content normal.          Assessment & Plan:

## 2012-03-17 ENCOUNTER — Telehealth: Payer: Self-pay | Admitting: Internal Medicine

## 2012-03-17 NOTE — Telephone Encounter (Signed)
The wound did not appear infected at her visit. If it has changed, she should be re-evaluated.

## 2012-03-17 NOTE — Telephone Encounter (Signed)
Pt called and notified to keep the appt with the wound center to have them evaluate the wound.

## 2012-03-17 NOTE — Telephone Encounter (Signed)
Pt daughter called stating that she was seen at [facility] wrapped her leg. Pt is worried that due to the size of the wound if she should be on an antibiotic. Please advise.

## 2012-03-17 NOTE — Telephone Encounter (Signed)
W[facility] Healing Center is calling and they are saying that Tanya Rice is confused on if she should go to the wound healing center because she is going to [facility] and they are wrapping her leg. She was seen this last Wednesday and seen again on the 18th. They also are saying that she does not have a clot in her leg. Should she keep the referral for Wound Healing ??? Best Number for wound center to cancel if we need to for pt [phone]

## 2012-03-20 ENCOUNTER — Ambulatory Visit: Payer: Self-pay | Admitting: Oncology

## 2012-03-20 LAB — CBC CANCER CENTER
Basophil #: 0 x10 3/mm (ref 0.0–0.1)
Basophil %: 0.5 %
Eosinophil #: 0.1 x10 3/mm (ref 0.0–0.7)
Eosinophil %: 1.8 %
HCT: 28.5 % — ABNORMAL LOW (ref 35.0–47.0)
HGB: 9.8 g/dL — ABNORMAL LOW (ref 12.0–16.0)
Lymphocyte #: 1 x10 3/mm (ref 1.0–3.6)
Lymphocyte %: 23.4 %
MCV: 129 fL — ABNORMAL HIGH (ref 80–100)
Monocyte #: 0.5 x10 3/mm (ref 0.2–0.9)
Neutrophil #: 2.6 x10 3/mm (ref 1.4–6.5)
Neutrophil %: 61.5 %
RDW: 14.1 % (ref 11.5–14.5)
WBC: 4.2 x10 3/mm (ref 3.6–11.0)

## 2012-03-20 LAB — BASIC METABOLIC PANEL
Anion Gap: 9 (ref 7–16)
BUN: 26 mg/dL — ABNORMAL HIGH (ref 7–18)
Calcium, Total: 8.4 mg/dL — ABNORMAL LOW (ref 8.5–10.1)
Co2: 26 mmol/L (ref 21–32)
Creatinine: 1.48 mg/dL — ABNORMAL HIGH (ref 0.60–1.30)
EGFR (African American): 38 — ABNORMAL LOW
EGFR (Non-African Amer.): 32 — ABNORMAL LOW
Osmolality: 274 (ref 275–301)
Potassium: 4.5 mmol/L (ref 3.5–5.1)

## 2012-03-21 ENCOUNTER — Encounter: Payer: Self-pay | Admitting: Cardiothoracic Surgery

## 2012-03-21 ENCOUNTER — Encounter: Payer: Self-pay | Admitting: Nurse Practitioner

## 2012-03-22 ENCOUNTER — Encounter: Payer: Self-pay | Admitting: Internal Medicine

## 2012-03-22 ENCOUNTER — Ambulatory Visit (INDEPENDENT_AMBULATORY_CARE_PROVIDER_SITE_OTHER): Payer: Medicare Other | Admitting: Internal Medicine

## 2012-03-22 VITALS — BP 150/82 | HR 80 | Temp 97.8°F | Resp 15 | Wt 122.5 lb

## 2012-03-22 DIAGNOSIS — I10 Essential (primary) hypertension: Secondary | ICD-10-CM

## 2012-03-22 DIAGNOSIS — R609 Edema, unspecified: Secondary | ICD-10-CM

## 2012-03-22 DIAGNOSIS — M79609 Pain in unspecified limb: Secondary | ICD-10-CM

## 2012-03-22 DIAGNOSIS — M79604 Pain in right leg: Secondary | ICD-10-CM

## 2012-03-22 DIAGNOSIS — S81801A Unspecified open wound, right lower leg, initial encounter: Secondary | ICD-10-CM

## 2012-03-22 DIAGNOSIS — R600 Localized edema: Secondary | ICD-10-CM

## 2012-03-22 DIAGNOSIS — Z1331 Encounter for screening for depression: Secondary | ICD-10-CM

## 2012-03-22 DIAGNOSIS — D473 Essential (hemorrhagic) thrombocythemia: Secondary | ICD-10-CM

## 2012-03-22 DIAGNOSIS — S81009A Unspecified open wound, unspecified knee, initial encounter: Secondary | ICD-10-CM

## 2012-03-22 MED ORDER — TRAMADOL HCL 50 MG PO TABS: 50.0000 mg | ORAL_TABLET | Freq: Three times a day (TID) | ORAL | Status: DC | PRN

## 2012-03-22 NOTE — Assessment & Plan Note (Signed)
Patient reports that recent blood work showed improved platelet count. Her dose of hydroxyurea was decreased by her oncologist. Will request recent labs from this visit.

## 2012-03-22 NOTE — Progress Notes (Signed)
Subjective:    Patient ID: Tanya Rice, female    DOB: 1926-06-06, 78 y.o.   MRN: [id number]  HPI 78 year old female with history of hypothyroidism, hypertension, essential thrombocythemia, and recent right lower extremity edema and open wound presents for followup. At her last visit, there was concern about possible DVT in her right leg because of extensive swelling. She underwent ultrasound which showed no DVT. She was also referred to the wound healing Center because of ongoing issue with open wound in her right lower extremity at previous biopsy site. She reports that she is being followed at the wound center and leg is bandaged today. She describes some ongoing pain in her right lower leg. She is unable to take nonsteroidal medications because of history of gastric hemorrhage. She is not currently taking any medication for pain. She reports that pain is an aching sensation in her right lower extremity. It keeps her awake at night and makes it difficult for her to function.  In regards to essential thrombycythemia, she reports that her oncologist recently recheck lab work and platelet count was improved. She was instructed to reduce her dose of hydroxyurea.  Outpatient Encounter Prescriptions as of 03/22/2012  Medication Sig Dispense Refill  . aspirin EC 81 MG tablet Take on Monday, Wednesday, and Friday only      . Calcium Carbonate-Vitamin D (CALCIUM + D PO) Take 1 tablet by mouth daily.       . diazepam (VALIUM) 2 MG tablet Take 2 mg by mouth at bedtime.      . Eszopiclone 3 MG TABS Take 1 tablet (3 mg total) by mouth at bedtime.  30 tablet  3  . fexofenadine (ALLEGRA) 180 MG tablet Take 1 tablet (180 mg total) by mouth daily.  30 tablet  6  . furosemide (LASIX) 40 MG tablet Take 1 tablet (40 mg total) by mouth daily.  30 tablet  12  . hydroxyurea (HYDREA) 500 MG capsule Take 500 mg by mouth daily. Mon,Wed,Fri take 1 pill. Tues,Thurs, Sat, Sun take 2 pills      . lactose free nutrition  (BOOST) LIQD Take 1 Container by mouth daily.      Marland Kitchen levothyroxine (SYNTHROID, LEVOTHROID) 50 MCG tablet Take 1 tablet (50 mcg total) by mouth daily.  30 tablet  6  . meclizine (ANTIVERT) 25 MG tablet Take 1 tablet (25 mg total) by mouth 3 (three) times daily as needed. For vertigo  30 tablet  0  . metoprolol tartrate (LOPRESSOR) 25 MG tablet Take 1 tablet (25 mg total) by mouth 2 (two) times daily.  60 tablet  6  . Multiple Vitamins-Minerals (MULTIVITAMIN WITH MINERALS) tablet Take 1 tablet by mouth daily.      . pantoprazole (PROTONIX) 40 MG tablet TAKE ONE TABLET TWICE A DAY  180 tablet  3  . temazepam (RESTORIL) 15 MG capsule       . [DISCONTINUED] hydroxyurea (HYDREA) 500 MG capsule Take 1 capsule (500 mg total) by mouth daily. Takes 2 caps in the am, 1 cap in the pm  30 capsule  3  . traMADol (ULTRAM) 50 MG tablet Take 1 tablet (50 mg total) by mouth every 8 (eight) hours as needed for pain.  30 tablet  0    Review of Systems  Constitutional: Negative for fever, chills, appetite change, fatigue and unexpected weight change.  HENT: Negative for ear pain, congestion, sore throat, trouble swallowing, neck pain, voice change and sinus pressure.   Eyes: Negative for  visual disturbance.  Respiratory: Negative for cough, shortness of breath, wheezing and stridor.   Cardiovascular: Positive for leg swelling. Negative for chest pain and palpitations.  Gastrointestinal: Negative for nausea, vomiting, abdominal pain, diarrhea, constipation, blood in stool, abdominal distention and anal bleeding.  Genitourinary: Negative for dysuria and flank pain.  Musculoskeletal: Positive for myalgias. Negative for arthralgias and gait problem.  Skin: Negative for color change and rash.  Neurological: Negative for dizziness and headaches.  Hematological: Negative for adenopathy. Does not bruise/bleed easily.  Psychiatric/Behavioral: Negative for suicidal ideas, sleep disturbance and dysphoric mood. The patient is  not nervous/anxious.        Objective:   Physical Exam  Constitutional: She is oriented to person, place, and time. She appears well-developed and well-nourished. No distress.  HENT:  Head: Normocephalic and atraumatic.  Right Ear: External ear normal.  Left Ear: External ear normal.  Nose: Nose normal.  Mouth/Throat: Oropharynx is clear and moist. No oropharyngeal exudate.  Eyes: Conjunctivae normal are normal. Pupils are equal, round, and reactive to light. Right eye exhibits no discharge. Left eye exhibits no discharge. No scleral icterus.  Neck: Normal range of motion. Neck supple. No tracheal deviation present. No thyromegaly present.  Cardiovascular: Normal rate, regular rhythm, normal heart sounds and intact distal pulses.  Exam reveals no gallop and no friction rub.   No murmur heard. Pulmonary/Chest: Effort normal and breath sounds normal. No respiratory distress. She has no wheezes. She has no rales. She exhibits no tenderness.  Musculoskeletal: Normal range of motion. She exhibits edema (RLE, UNNA boot in place). She exhibits no tenderness.  Lymphadenopathy:    She has no cervical adenopathy.  Neurological: She is alert and oriented to person, place, and time. No cranial nerve deficit. She exhibits normal muscle tone. Coordination normal.  Skin: Skin is warm and dry. No rash noted. She is not diaphoretic. No erythema. No pallor.  Psychiatric: She has a normal mood and affect. Her behavior is normal. Judgment and thought content normal.          Assessment & Plan:

## 2012-03-22 NOTE — Assessment & Plan Note (Signed)
Patient reports wound is improving. Will request notes from wound healing Center.

## 2012-03-22 NOTE — Assessment & Plan Note (Signed)
Patient has some pain in her right leg at the site of edema and open wound. Ultrasound for DVT was negative. Will try adding tramadol to help with leg pain. Patient will call if no improvement on this medication.

## 2012-03-22 NOTE — Patient Instructions (Signed)
Increase Metoprolol to twice daily. Monitor blood pressure at home. Call if blood pressure >140/90 consistently.

## 2012-03-22 NOTE — Assessment & Plan Note (Signed)
Lower extremity edema is improved and leg is currently wrapped in [employer]. Patient will continue to follow with the wound healing Center. Encouraged her to limit sodium intake and keep leg elevated as much as possible. Followup here in one month.

## 2012-03-22 NOTE — Assessment & Plan Note (Signed)
Blood pressure has been elevated at last 2 visits. Suspect, this is in part related to ongoing pain issues in the right lower extremity. However, encouraged her to take her metoprolol 25 mg twice daily rather than once daily for better coverage. She will monitor blood pressure at home and call if blood pressure consistently greater than 140/90.

## 2012-04-05 ENCOUNTER — Encounter: Payer: Self-pay | Admitting: Cardiothoracic Surgery

## 2012-04-05 ENCOUNTER — Ambulatory Visit: Payer: Self-pay | Admitting: Oncology

## 2012-04-05 ENCOUNTER — Encounter: Payer: Self-pay | Admitting: Nurse Practitioner

## 2012-04-05 ENCOUNTER — Encounter: Payer: Self-pay | Admitting: Internal Medicine

## 2012-04-12 ENCOUNTER — Ambulatory Visit (INDEPENDENT_AMBULATORY_CARE_PROVIDER_SITE_OTHER): Payer: Medicare Other | Admitting: Internal Medicine

## 2012-04-12 ENCOUNTER — Encounter: Payer: Self-pay | Admitting: Internal Medicine

## 2012-04-12 VITALS — BP 130/70 | HR 58 | Temp 98.6°F | Ht 61.0 in | Wt 119.5 lb

## 2012-04-12 DIAGNOSIS — R609 Edema, unspecified: Secondary | ICD-10-CM

## 2012-04-12 DIAGNOSIS — R2681 Unsteadiness on feet: Secondary | ICD-10-CM

## 2012-04-12 DIAGNOSIS — S81809A Unspecified open wound, unspecified lower leg, initial encounter: Secondary | ICD-10-CM

## 2012-04-12 DIAGNOSIS — S81801A Unspecified open wound, right lower leg, initial encounter: Secondary | ICD-10-CM

## 2012-04-12 DIAGNOSIS — R600 Localized edema: Secondary | ICD-10-CM

## 2012-04-12 DIAGNOSIS — M79604 Pain in right leg: Secondary | ICD-10-CM

## 2012-04-12 DIAGNOSIS — R269 Unspecified abnormalities of gait and mobility: Secondary | ICD-10-CM

## 2012-04-12 DIAGNOSIS — Q068 Other specified congenital malformations of spinal cord: Secondary | ICD-10-CM

## 2012-04-12 DIAGNOSIS — M79609 Pain in unspecified limb: Secondary | ICD-10-CM

## 2012-04-12 DIAGNOSIS — I10 Essential (primary) hypertension: Secondary | ICD-10-CM

## 2012-04-12 NOTE — Assessment & Plan Note (Signed)
Will request labs from hematologist. Patient has followup with hematologist next

## 2012-04-12 NOTE — Assessment & Plan Note (Signed)
Some gait instability noted particularly with bandage over her right leg. Will set up physical and occupational therapy for strength and gait training. Rx for rolling Zaynab Chipman sent today.

## 2012-04-12 NOTE — Assessment & Plan Note (Signed)
Symptoms persist with open wound and bandage in place. We'll continue tramadol.

## 2012-04-12 NOTE — Assessment & Plan Note (Signed)
Blood pressure well-controlled on current medications. Will continue. 

## 2012-04-12 NOTE — Assessment & Plan Note (Signed)
Followed at wound healing Center. Patient appears to be improving. Will request notes from wound healing Center.

## 2012-04-12 NOTE — Progress Notes (Signed)
Subjective:    Patient ID: Tanya Rice, female    DOB: December 30, 1926, 79 y.o.   MRN: [id number]  HPI 79 year old female with history of essential thrombocythemia, myelodysplasia, hypertension, lower extremity edema with ulcer in her right lower extremity presents for followup. In the interim since her last visit, she has been evaluated and followed at the wound healing Center. She reports that ulceration of her right lower extremity has been improving. Her right lower leg is currently wrapped in a compression bandage. She reports they have been changing the bandage every 4 days. She continues to have some discomfort in her right lower extremity, particularly the bandage in place and has been using tramadol with some improvement. She reports she has scheduled followup with her hematologist including lab work to reassess her platelet count and hemoglobin next week. She reports full compliance with her medications. She has been trying to increase her physical activity with walking but is having some gait instability. She is currently using a cane. She would like to use a rolling walker. She would also like to restart physical therapy to help with balance and strength.  Outpatient Encounter Prescriptions as of 04/12/2012  Medication Sig Dispense Refill  . aspirin EC 81 MG tablet Take on Monday, Wednesday, and Friday only      . Calcium Carbonate-Vitamin D (CALCIUM + D PO) Take 1 tablet by mouth daily.       . diazepam (VALIUM) 2 MG tablet Take 2 mg by mouth at bedtime.      . Eszopiclone 3 MG TABS Take 1 tablet (3 mg total) by mouth at bedtime.  30 tablet  3  . fexofenadine (ALLEGRA) 180 MG tablet Take 1 tablet (180 mg total) by mouth daily.  30 tablet  6  . furosemide (LASIX) 40 MG tablet Take 1 tablet (40 mg total) by mouth daily.  30 tablet  12  . hydroxyurea (HYDREA) 500 MG capsule Take 500 mg by mouth daily. Mon,Wed,Fri take 1 pill. Tues,Thurs, Sat, Sun take 2 pills      . lactose free nutrition  (BOOST) LIQD Take 1 Container by mouth daily.      Marland Kitchen levothyroxine (SYNTHROID, LEVOTHROID) 50 MCG tablet Take 1 tablet (50 mcg total) by mouth daily.  30 tablet  6  . meclizine (ANTIVERT) 25 MG tablet Take 1 tablet (25 mg total) by mouth 3 (three) times daily as needed. For vertigo  30 tablet  0  . metoprolol tartrate (LOPRESSOR) 25 MG tablet Take 1 tablet (25 mg total) by mouth 2 (two) times daily.  60 tablet  6  . Multiple Vitamins-Minerals (MULTIVITAMIN WITH MINERALS) tablet Take 1 tablet by mouth daily.      . pantoprazole (PROTONIX) 40 MG tablet TAKE ONE TABLET TWICE A DAY  180 tablet  3  . temazepam (RESTORIL) 15 MG capsule       . traMADol (ULTRAM) 50 MG tablet Take 1 tablet (50 mg total) by mouth every 8 (eight) hours as needed for pain.  30 tablet  0   BP 130/70  Pulse 58  Temp 98.6 F (37 C) (Oral)  Ht 5\' 1"  (1.549 m)  Wt 119 lb 8 oz (54.205 kg)  BMI 22.58 kg/m2  SpO2 99%  Review of Systems  Constitutional: Negative for fever, chills, appetite change, fatigue and unexpected weight change.  HENT: Negative for ear pain, congestion, sore throat, trouble swallowing, neck pain, voice change and sinus pressure.   Eyes: Negative for visual disturbance.  Respiratory:  Negative for cough, shortness of breath, wheezing and stridor.   Cardiovascular: Negative for chest pain, palpitations and leg swelling.  Gastrointestinal: Negative for nausea, vomiting, abdominal pain, diarrhea, constipation, blood in stool, abdominal distention and anal bleeding.  Genitourinary: Negative for dysuria and flank pain.  Musculoskeletal: Positive for myalgias and arthralgias. Negative for gait problem.  Skin: Negative for color change and rash.  Neurological: Negative for dizziness and headaches.  Hematological: Negative for adenopathy. Does not bruise/bleed easily.  Psychiatric/Behavioral: Negative for suicidal ideas, sleep disturbance and dysphoric mood. The patient is not nervous/anxious.          Objective:   Physical Exam  Constitutional: She is oriented to person, place, and time. She appears well-developed and well-nourished. No distress.  HENT:  Head: Normocephalic and atraumatic.  Right Ear: External ear normal.  Left Ear: External ear normal.  Nose: Nose normal.  Mouth/Throat: Oropharynx is clear and moist. No oropharyngeal exudate.  Eyes: Conjunctivae normal are normal. Pupils are equal, round, and reactive to light. Right eye exhibits no discharge. Left eye exhibits no discharge. No scleral icterus.  Neck: Normal range of motion. Neck supple. No tracheal deviation present. No thyromegaly present.  Cardiovascular: Normal rate, regular rhythm, normal heart sounds and intact distal pulses.  Exam reveals no gallop and no friction rub.   No murmur heard. Pulmonary/Chest: Effort normal and breath sounds normal. No respiratory distress. She has no wheezes. She has no rales. She exhibits no tenderness.  Musculoskeletal: Normal range of motion. She exhibits no edema and no tenderness.       Legs: Lymphadenopathy:    She has no cervical adenopathy.  Neurological: She is alert and oriented to person, place, and time. No cranial nerve deficit. She exhibits normal muscle tone. Coordination normal.  Skin: Skin is warm and dry. No rash noted. She is not diaphoretic. No erythema. No pallor.  Psychiatric: She has a normal mood and affect. Her behavior is normal. Judgment and thought content normal.          Assessment & Plan:

## 2012-04-12 NOTE — Assessment & Plan Note (Signed)
Improved with compression bandage. Will continue.

## 2012-05-01 ENCOUNTER — Ambulatory Visit: Payer: Self-pay | Admitting: Oncology

## 2012-05-01 LAB — CBC CANCER CENTER
Basophil #: 0 x10 3/mm (ref 0.0–0.1)
Basophil %: 0.5 %
Eosinophil #: 0.1 x10 3/mm (ref 0.0–0.7)
Eosinophil %: 2.1 %
HCT: 31.3 % — ABNORMAL LOW (ref 35.0–47.0)
MCH: 43.1 pg — ABNORMAL HIGH (ref 26.0–34.0)
MCHC: 33.8 g/dL (ref 32.0–36.0)
MCV: 128 fL — ABNORMAL HIGH (ref 80–100)
Monocyte #: 0.6 x10 3/mm (ref 0.2–0.9)
Neutrophil %: 66.5 %
Platelet: 675 x10 3/mm — ABNORMAL HIGH (ref 150–440)
RBC: 2.45 10*6/uL — ABNORMAL LOW (ref 3.80–5.20)
RDW: 13.1 % (ref 11.5–14.5)

## 2012-05-01 LAB — COMPREHENSIVE METABOLIC PANEL
Albumin: 3.5 g/dL (ref 3.4–5.0)
Alkaline Phosphatase: 76 U/L (ref 50–136)
Anion Gap: 6 — ABNORMAL LOW (ref 7–16)
BUN: 21 mg/dL — ABNORMAL HIGH (ref 7–18)
Calcium, Total: 8.8 mg/dL (ref 8.5–10.1)
Chloride: 100 mmol/L (ref 98–107)
Co2: 28 mmol/L (ref 21–32)
EGFR (Non-African Amer.): 35 — ABNORMAL LOW
Glucose: 90 mg/dL (ref 65–99)
Osmolality: 271 (ref 275–301)
Potassium: 4.8 mmol/L (ref 3.5–5.1)
SGOT(AST): 42 U/L — ABNORMAL HIGH (ref 15–37)
SGPT (ALT): 40 U/L (ref 12–78)
Sodium: 134 mmol/L — ABNORMAL LOW (ref 136–145)

## 2012-05-01 LAB — TSH: Thyroid Stimulating Horm: 4.45 u[IU]/mL

## 2012-05-06 ENCOUNTER — Encounter: Payer: Self-pay | Admitting: Cardiothoracic Surgery

## 2012-05-06 ENCOUNTER — Encounter: Payer: Self-pay | Admitting: Nurse Practitioner

## 2012-05-06 ENCOUNTER — Ambulatory Visit: Payer: Self-pay | Admitting: Oncology

## 2012-05-29 ENCOUNTER — Telehealth: Payer: Self-pay | Admitting: *Deleted

## 2012-05-29 DIAGNOSIS — G4700 Insomnia, unspecified: Secondary | ICD-10-CM

## 2012-05-29 NOTE — Telephone Encounter (Signed)
Refill Request  Lunesta 3 mg  #30  Take one tablet at bedtime

## 2012-05-30 NOTE — Telephone Encounter (Signed)
Fine to refill #30 with 3 refills. 

## 2012-06-01 MED ORDER — ESZOPICLONE 3 MG PO TABS: 3.0000 mg | ORAL_TABLET | Freq: Every day | ORAL | Status: DC

## 2012-06-01 NOTE — Telephone Encounter (Signed)
Rx printed to be signed and faxed to pharmacy.  

## 2012-06-03 ENCOUNTER — Encounter: Payer: Self-pay | Admitting: Nurse Practitioner

## 2012-06-03 ENCOUNTER — Ambulatory Visit: Payer: Self-pay | Admitting: Oncology

## 2012-06-03 ENCOUNTER — Encounter: Payer: Self-pay | Admitting: Cardiothoracic Surgery

## 2012-06-13 ENCOUNTER — Other Ambulatory Visit: Payer: Self-pay | Admitting: *Deleted

## 2012-06-13 NOTE — Telephone Encounter (Signed)
Ok to refill 

## 2012-06-14 MED ORDER — TEMAZEPAM 15 MG PO CAPS: 15.0000 mg | ORAL_CAPSULE | Freq: Every evening | ORAL | Status: AC | PRN

## 2012-06-19 ENCOUNTER — Ambulatory Visit: Payer: Self-pay | Admitting: Oncology

## 2012-06-19 LAB — COMPREHENSIVE METABOLIC PANEL
Albumin: 3.6 g/dL (ref 3.4–5.0)
Anion Gap: 9 (ref 7–16)
BUN: 32 mg/dL — ABNORMAL HIGH (ref 7–18)
Bilirubin,Total: 0.4 mg/dL (ref 0.2–1.0)
Chloride: 103 mmol/L (ref 98–107)
Co2: 26 mmol/L (ref 21–32)
Creatinine: 1.72 mg/dL — ABNORMAL HIGH (ref 0.60–1.30)
EGFR (African American): 31 — ABNORMAL LOW
EGFR (Non-African Amer.): 27 — ABNORMAL LOW
Glucose: 87 mg/dL (ref 65–99)
Osmolality: 282 (ref 275–301)
Potassium: 4.2 mmol/L (ref 3.5–5.1)
SGOT(AST): 29 U/L (ref 15–37)
SGPT (ALT): 25 U/L (ref 12–78)
Sodium: 138 mmol/L (ref 136–145)
Total Protein: 7.6 g/dL (ref 6.4–8.2)

## 2012-06-19 LAB — CBC CANCER CENTER
Basophil #: 0 x10 3/mm (ref 0.0–0.1)
Eosinophil #: 0.1 x10 3/mm (ref 0.0–0.7)
HCT: 31.1 % — ABNORMAL LOW (ref 35.0–47.0)
HGB: 10.5 g/dL — ABNORMAL LOW (ref 12.0–16.0)
Lymphocyte #: 1.4 x10 3/mm (ref 1.0–3.6)
Lymphocyte %: 21.7 %
MCHC: 33.7 g/dL (ref 32.0–36.0)
MCV: 127 fL — ABNORMAL HIGH (ref 80–100)
Monocyte #: 0.6 x10 3/mm (ref 0.2–0.9)
Monocyte %: 9.6 %
Neutrophil %: 66.1 %
Platelet: 712 x10 3/mm — ABNORMAL HIGH (ref 150–440)
RBC: 2.45 10*6/uL — ABNORMAL LOW (ref 3.80–5.20)
RDW: 13.2 % (ref 11.5–14.5)
WBC: 6.5 x10 3/mm (ref 3.6–11.0)

## 2012-07-03 ENCOUNTER — Telehealth: Payer: Self-pay | Admitting: Internal Medicine

## 2012-07-03 NOTE — Telephone Encounter (Signed)
Only if her dentist requires it for some reason. No clinical indication for antibiotics.

## 2012-07-03 NOTE — Telephone Encounter (Signed)
Fwd to Dr. Walker 

## 2012-07-03 NOTE — Telephone Encounter (Signed)
Patient having a root canal done on 4.10.14. She is wanting to know if she will need an antibiotic before having it done.

## 2012-07-04 ENCOUNTER — Ambulatory Visit: Payer: Self-pay | Admitting: Oncology

## 2012-07-04 NOTE — Telephone Encounter (Signed)
Patient informed and verbally agreed.  

## 2012-07-07 ENCOUNTER — Other Ambulatory Visit: Payer: Self-pay | Admitting: Internal Medicine

## 2012-08-02 ENCOUNTER — Other Ambulatory Visit: Payer: Self-pay | Admitting: Internal Medicine

## 2012-08-14 ENCOUNTER — Ambulatory Visit (INDEPENDENT_AMBULATORY_CARE_PROVIDER_SITE_OTHER): Payer: Medicare Other | Admitting: Internal Medicine

## 2012-08-14 ENCOUNTER — Encounter: Payer: Self-pay | Admitting: Internal Medicine

## 2012-08-14 VITALS — BP 144/74 | HR 60 | Temp 97.9°F | Wt 125.0 lb

## 2012-08-14 DIAGNOSIS — D473 Essential (hemorrhagic) thrombocythemia: Secondary | ICD-10-CM

## 2012-08-14 DIAGNOSIS — I10 Essential (primary) hypertension: Secondary | ICD-10-CM

## 2012-08-14 DIAGNOSIS — E538 Deficiency of other specified B group vitamins: Secondary | ICD-10-CM

## 2012-08-14 DIAGNOSIS — D510 Vitamin B12 deficiency anemia due to intrinsic factor deficiency: Secondary | ICD-10-CM

## 2012-08-14 DIAGNOSIS — E039 Hypothyroidism, unspecified: Secondary | ICD-10-CM

## 2012-08-14 LAB — POCT URINALYSIS DIPSTICK
Bilirubin, UA: NEGATIVE
Ketones, UA: NEGATIVE
Leukocytes, UA: NEGATIVE
pH, UA: 5

## 2012-08-14 NOTE — Assessment & Plan Note (Signed)
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 Will restart B12 injections today, and plan for monthly B12.

## 2012-08-14 NOTE — Assessment & Plan Note (Signed)
Symptomatically doing well. Will check TSH with labs today. Continue levothyroxine. 

## 2012-08-14 NOTE — Progress Notes (Signed)
Subjective:    Patient ID: Tanya Rice, female    DOB: Jun 12, 1926, 79 y.o.   MRN: [id number]  HPI 79 year old female with history of essential thrombocythemia, hypothyroidism, hypertension, history of gastric hemorrhage, insomnia, pernicious anemia presents for followup. She reports she is generally doing well. She notes some difficulty with her balance which she attributes to lack of B12 given that there has been a Sport and exercise psychologist and she has not been able to receive injections. She has not had any falls. Her energy level has been generally good. She has been sleeping well without the use of Lunesta. She occasionally uses temazepam on a regular basis. She denies any recent abdominal pain, change in appetite, change in bowel habits. She notes that the wound on her right lower extremity has healed.  Outpatient Encounter Prescriptions as of 08/14/2012  Medication Sig Dispense Refill  . aspirin EC 81 MG tablet Take on Monday, Wednesday, and Friday only      . hydroxyurea (HYDREA) 500 MG capsule Take 500 mg by mouth daily. Mon,Wed,Fri take 1 pill. Tues,Thurs, Sat, Sun take 2 pills      . levothyroxine (SYNTHROID, LEVOTHROID) 50 MCG tablet TAKE ONE TABLET EVERY MORNING ON AN EMPTY STOMACH  30 tablet  6  . metoprolol tartrate (LOPRESSOR) 25 MG tablet TAKE ONE TABLET TWICE A DAY  60 tablet  3  . pantoprazole (PROTONIX) 40 MG tablet TAKE ONE TABLET TWICE A DAY  180 tablet  3  . temazepam (RESTORIL) 15 MG capsule Take 1 capsule (15 mg total) by mouth at bedtime as needed for sleep.  30 capsule  3   No facility-administered encounter medications on file as of 08/14/2012.   BP 144/74  Pulse 60  Temp(Src) 97.9 F (36.6 C) (Oral)  Wt 125 lb (56.7 kg)  BMI 23.63 kg/m2  SpO2 97%  Review of Systems  Constitutional: Negative for fever, chills, appetite change, fatigue and unexpected weight change.  HENT: Negative for ear pain, congestion, sore throat, trouble swallowing, neck pain, voice change and  sinus pressure.   Eyes: Negative for visual disturbance.  Respiratory: Negative for cough, shortness of breath, wheezing and stridor.   Cardiovascular: Negative for chest pain, palpitations and leg swelling.  Gastrointestinal: Negative for nausea, vomiting, abdominal pain, diarrhea, constipation, blood in stool, abdominal distention and anal bleeding.  Genitourinary: Negative for dysuria and flank pain.  Musculoskeletal: Negative for myalgias, arthralgias and gait problem.  Skin: Negative for color change and rash.  Neurological: Negative for dizziness and headaches.  Hematological: Negative for adenopathy. Does not bruise/bleed easily.  Psychiatric/Behavioral: Negative for suicidal ideas, sleep disturbance and dysphoric mood. The patient is not nervous/anxious.        Objective:   Physical Exam  Constitutional: She is oriented to person, place, and time. She appears well-developed and well-nourished. No distress.  HENT:  Head: Normocephalic and atraumatic.  Right Ear: External ear normal. Decreased hearing is noted.  Left Ear: External ear normal. Decreased hearing is noted.  Nose: Nose normal.  Mouth/Throat: Oropharynx is clear and moist. No oropharyngeal exudate.  Eyes: Conjunctivae are normal. Pupils are equal, round, and reactive to light. Right eye exhibits no discharge. Left eye exhibits no discharge. No scleral icterus.  Neck: Normal range of motion. Neck supple. No tracheal deviation present. No thyromegaly present.  Cardiovascular: Normal rate, regular rhythm, normal heart sounds and intact distal pulses.  Exam reveals no gallop and no friction rub.   No murmur heard. Pulmonary/Chest: Effort normal and breath sounds  normal. No accessory muscle usage. Not tachypneic. No respiratory distress. She has no decreased breath sounds. She has no wheezes. She has no rhonchi. She has no rales. She exhibits no tenderness.  Musculoskeletal: Normal range of motion. She exhibits no edema and  no tenderness.  Lymphadenopathy:    She has no cervical adenopathy.  Neurological: She is alert and oriented to person, place, and time. No cranial nerve deficit. She exhibits normal muscle tone. Coordination normal.  Skin: Skin is warm and dry. No rash noted. She is not diaphoretic. No erythema. No pallor.  Psychiatric: She has a normal mood and affect. Her behavior is normal. Judgment and thought content normal.          Assessment & Plan:

## 2012-08-14 NOTE — Assessment & Plan Note (Signed)
Will check CBC with labs today. Continue HU.

## 2012-08-14 NOTE — Assessment & Plan Note (Signed)
BP Readings from Last 3 Encounters:  08/14/12 144/74  04/12/12 130/70  03/22/12 150/82   BP generally well controlled with current medications. Will continue.

## 2012-08-15 LAB — COMPREHENSIVE METABOLIC PANEL
ALT: 19 U/L (ref 0–35)
Albumin: 3.7 g/dL (ref 3.5–5.2)
CO2: 25 mEq/L (ref 19–32)
Calcium: 8.8 mg/dL (ref 8.4–10.5)
Chloride: 101 mEq/L (ref 96–112)
GFR: 37.12 mL/min — ABNORMAL LOW (ref >60.00)
Glucose, Bld: 80 mg/dL (ref 70–99)
Sodium: 132 mEq/L — ABNORMAL LOW (ref 135–145)
Total Protein: 7.4 g/dL (ref 6.0–8.3)

## 2012-08-15 LAB — CBC WITH DIFFERENTIAL/PLATELET
Basophils Relative: 0.9 % (ref 0.0–3.0)
Eosinophils Relative: 2.3 % (ref 0.0–5.0)
MCV: 120.2 fl — ABNORMAL HIGH (ref 78.0–100.0)
Monocytes Absolute: 0.5 10*3/uL (ref 0.1–1.0)
Neutrophils Relative %: 69.9 % (ref 43.0–77.0)
RBC: 2.61 Mil/uL — ABNORMAL LOW (ref 3.87–5.11)
WBC: 8.1 10*3/uL (ref 4.5–10.5)

## 2012-08-15 MED ORDER — CYANOCOBALAMIN 1000 MCG/ML IJ SOLN
1000.0000 ug | Freq: Once | INTRAMUSCULAR | Status: AC
  Administered 2012-08-14: 1000 ug via INTRAMUSCULAR

## 2012-08-15 NOTE — Addendum Note (Signed)
Addended by: Theola Sequin on: 08/15/2012 10:25 AM   Modules accepted: Orders

## 2012-08-24 ENCOUNTER — Telehealth: Payer: Self-pay | Admitting: Internal Medicine

## 2012-08-24 NOTE — Telephone Encounter (Signed)
Patient informed of lab results and she verbally understood. Lab results are viewable on Mychart.

## 2012-08-24 NOTE — Telephone Encounter (Signed)
Patient wanting the results of her labs.

## 2012-08-29 ENCOUNTER — Other Ambulatory Visit: Payer: Self-pay | Admitting: Internal Medicine

## 2012-08-29 NOTE — Telephone Encounter (Signed)
Eprescribed.

## 2012-09-04 ENCOUNTER — Ambulatory Visit: Payer: Self-pay | Admitting: Oncology

## 2012-09-05 LAB — CBC CANCER CENTER
Eosinophil %: 3.5 %
HGB: 11.1 g/dL — ABNORMAL LOW (ref 12.0–16.0)
Lymphocyte #: 1.3 x10 3/mm (ref 1.0–3.6)
MCH: 41.5 pg — ABNORMAL HIGH (ref 26.0–34.0)
WBC: 6.3 x10 3/mm (ref 3.6–11.0)

## 2012-09-05 LAB — COMPREHENSIVE METABOLIC PANEL
Albumin: 3.5 g/dL (ref 3.4–5.0)
Alkaline Phosphatase: 96 U/L (ref 50–136)
Anion Gap: 9 (ref 7–16)
BUN: 32 mg/dL — ABNORMAL HIGH (ref 7–18)
Bilirubin,Total: 0.4 mg/dL (ref 0.2–1.0)
Chloride: 103 mmol/L (ref 98–107)
Co2: 24 mmol/L (ref 21–32)
Creatinine: 1.82 mg/dL — ABNORMAL HIGH (ref 0.60–1.30)
EGFR (Non-African Amer.): 25 — ABNORMAL LOW
Glucose: 91 mg/dL (ref 65–99)
Osmolality: 278 (ref 275–301)
Potassium: 4.8 mmol/L (ref 3.5–5.1)
SGOT(AST): 32 U/L (ref 15–37)

## 2012-09-15 ENCOUNTER — Ambulatory Visit: Payer: Medicare Other

## 2012-09-20 ENCOUNTER — Ambulatory Visit (INDEPENDENT_AMBULATORY_CARE_PROVIDER_SITE_OTHER): Payer: Medicare Other | Admitting: *Deleted

## 2012-09-20 DIAGNOSIS — E538 Deficiency of other specified B group vitamins: Secondary | ICD-10-CM

## 2012-09-20 MED ORDER — CYANOCOBALAMIN 1000 MCG/ML IJ SOLN
1000.0000 ug | Freq: Once | INTRAMUSCULAR | Status: AC
  Administered 2012-09-20: 1000 ug via INTRAMUSCULAR

## 2012-10-03 ENCOUNTER — Ambulatory Visit: Payer: Self-pay | Admitting: Oncology

## 2012-10-24 ENCOUNTER — Ambulatory Visit (INDEPENDENT_AMBULATORY_CARE_PROVIDER_SITE_OTHER): Payer: Medicare Other | Admitting: *Deleted

## 2012-10-24 DIAGNOSIS — E538 Deficiency of other specified B group vitamins: Secondary | ICD-10-CM

## 2012-10-24 LAB — CBC CANCER CENTER
Basophil #: 0 x10 3/mm (ref 0.0–0.1)
Eosinophil #: 0.3 x10 3/mm (ref 0.0–0.7)
Lymphocyte #: 1.3 x10 3/mm (ref 1.0–3.6)
Lymphocyte %: 18.3 %
MCH: 41.6 pg — ABNORMAL HIGH (ref 26.0–34.0)
MCHC: 34.9 g/dL (ref 32.0–36.0)
MCV: 119 fL — ABNORMAL HIGH (ref 80–100)
Monocyte #: 0.7 x10 3/mm (ref 0.2–0.9)
Neutrophil %: 67.5 %
RBC: 2.44 10*6/uL — ABNORMAL LOW (ref 3.80–5.20)
RDW: 14.6 % — ABNORMAL HIGH (ref 11.5–14.5)
WBC: 7.3 x10 3/mm (ref 3.6–11.0)

## 2012-10-24 LAB — COMPREHENSIVE METABOLIC PANEL
Albumin: 3.3 g/dL — ABNORMAL LOW (ref 3.4–5.0)
Alkaline Phosphatase: 74 U/L (ref 50–136)
Anion Gap: 8 (ref 7–16)
BUN: 34 mg/dL — ABNORMAL HIGH (ref 7–18)
Calcium, Total: 8.6 mg/dL (ref 8.5–10.1)
Chloride: 101 mmol/L (ref 98–107)
Co2: 24 mmol/L (ref 21–32)
EGFR (African American): 36 — ABNORMAL LOW
EGFR (Non-African Amer.): 31 — ABNORMAL LOW
Glucose: 89 mg/dL (ref 65–99)
Potassium: 4.7 mmol/L (ref 3.5–5.1)
SGOT(AST): 27 U/L (ref 15–37)
SGPT (ALT): 20 U/L (ref 12–78)
Sodium: 133 mmol/L — ABNORMAL LOW (ref 136–145)

## 2012-10-24 MED ORDER — CYANOCOBALAMIN 1000 MCG/ML IJ SOLN
1000.0000 ug | Freq: Once | INTRAMUSCULAR | Status: AC
  Administered 2012-10-24: 1000 ug via INTRAMUSCULAR

## 2012-11-03 ENCOUNTER — Ambulatory Visit: Payer: Self-pay | Admitting: Oncology

## 2012-11-14 ENCOUNTER — Ambulatory Visit (INDEPENDENT_AMBULATORY_CARE_PROVIDER_SITE_OTHER): Payer: Medicare Other | Admitting: Internal Medicine

## 2012-11-14 ENCOUNTER — Encounter: Payer: Self-pay | Admitting: Internal Medicine

## 2012-11-14 VITALS — BP 120/72 | HR 62 | Temp 97.8°F | Ht 61.75 in | Wt 123.0 lb

## 2012-11-14 DIAGNOSIS — R2681 Unsteadiness on feet: Secondary | ICD-10-CM

## 2012-11-14 DIAGNOSIS — I10 Essential (primary) hypertension: Secondary | ICD-10-CM

## 2012-11-14 DIAGNOSIS — E039 Hypothyroidism, unspecified: Secondary | ICD-10-CM

## 2012-11-14 DIAGNOSIS — Q068 Other specified congenital malformations of spinal cord: Secondary | ICD-10-CM

## 2012-11-14 DIAGNOSIS — R269 Unspecified abnormalities of gait and mobility: Secondary | ICD-10-CM

## 2012-11-14 DIAGNOSIS — G4700 Insomnia, unspecified: Secondary | ICD-10-CM

## 2012-11-14 DIAGNOSIS — E785 Hyperlipidemia, unspecified: Secondary | ICD-10-CM

## 2012-11-14 DIAGNOSIS — Z0000 Encounter for general adult medical examination without abnormal findings: Secondary | ICD-10-CM

## 2012-11-14 DIAGNOSIS — Z780 Asymptomatic menopausal state: Secondary | ICD-10-CM

## 2012-11-14 LAB — LIPID PANEL
Cholesterol: 173 mg/dL (ref 0–200)
HDL: 44.1 mg/dL (ref >39.00)
LDL Cholesterol: 111 mg/dL — ABNORMAL HIGH (ref 0–99)
Triglycerides: 89 mg/dL (ref 0.0–149.0)
VLDL: 17.8 mg/dL (ref 0.0–40.0)

## 2012-11-14 LAB — COMPREHENSIVE METABOLIC PANEL
AST: 29 U/L (ref 0–37)
Alkaline Phosphatase: 59 U/L (ref 39–117)
BUN: 28 mg/dL — ABNORMAL HIGH (ref 6–23)
Calcium: 8.9 mg/dL (ref 8.4–10.5)
Chloride: 102 mEq/L (ref 96–112)
Creatinine, Ser: 1.6 mg/dL — ABNORMAL HIGH (ref 0.4–1.2)

## 2012-11-14 LAB — CBC WITH DIFFERENTIAL/PLATELET
Basophils Relative: 0.3 % (ref 0.0–3.0)
Eosinophils Absolute: 0.3 10*3/uL (ref 0.0–0.7)
Hemoglobin: 10.1 g/dL — ABNORMAL LOW (ref 12.0–15.0)
Lymphocytes Relative: 15.8 % (ref 12.0–46.0)
MCHC: 32.7 g/dL (ref 30.0–36.0)
Neutro Abs: 5.7 10*3/uL (ref 1.4–7.7)
RBC: 2.55 Mil/uL — ABNORMAL LOW (ref 3.87–5.11)

## 2012-11-14 NOTE — Assessment & Plan Note (Signed)
Will check TSH with labs. Continue Levothyroxine. 

## 2012-11-14 NOTE — Assessment & Plan Note (Signed)
Will check CBC with labs today. 

## 2012-11-14 NOTE — Assessment & Plan Note (Signed)
Will set up PT for strength training and gait training for falls prevention.

## 2012-11-14 NOTE — Progress Notes (Signed)
Subjective:    Patient ID: Tanya Rice, female    DOB: [birth year], 79 y.o.   MRN: [id number]  HPI The patient is here for annual Medicare wellness examination and management of other chronic and acute problems.   The risk factors are reflected in the social history.  The roster of all physicians providing medical care to patient - is listed in the Snapshot section of the chart.  Activities of daily living:  The patient is 100% independent in all ADLs: dressing, toileting, feeding as well as independent mobility. Pt continues to drive.  Home safety : The patient has smoke detectors in the home. They wear seatbelts.  There are no firearms at home. There is no violence in the home.   There is no risks for hepatitis, STDs or HIV. There is no history of blood transfusion. They have no travel history to infectious disease endemic areas of the world.  The patient has seen their dentist in the last six month. Dr. Axel Filler They have seen their eye doctor in the last year. Dr. Fransico Michael Hearing is poor. Followed at [facility] for hearing rehab. Has captioning device for telephone.  They do not  have excessive sun exposure. Discussed the need for sun protection: hats, long sleeves and use of sunscreen if there is significant sun exposure. Dermatologist - Dr. Wende Neighbors.  Diet: the importance of a healthy diet is discussed. They do have a healthy diet.  The benefits of regular aerobic exercise were discussed. She walks her driveway and follows a program through Physical Therapy.  Depression screen: there are no signs or vegative symptoms of depression- irritability, change in appetite, anhedonia, sadness/tearfullness.  Cognitive assessment: the patient manages all their financial and personal affairs and is actively engaged. They could relate day,date,year and events. Son, Tanya Rice, helps manage finances.  The following portions of the patient's history were reviewed and updated as  appropriate: allergies, current medications, past family history, past medical history,  past surgical history, past social history  and problem list.  Visual acuity was not assessed per patient preference since she has regular follow up with her ophthalmologist. Hearing and body mass index were assessed and reviewed.   During the course of the visit the patient was educated and counseled about appropriate screening and preventive services including : fall prevention , diabetes screening, nutrition counseling, colorectal cancer screening, and recommended immunizations.    She continues to follow with her oncologist for essential thrombocythemia. She reports recent blood counts showed good control of platelets with use of hydroxyurea.  She previously participated in physical therapy for strengthening and to help with gait instability. She notes some unsteady gait. She has not had any falls. She would like to repeat physical therapy to help with fall prevention and strength training.   Outpatient Encounter Prescriptions as of 11/14/2012  Medication Sig Dispense Refill  . aspirin EC 81 MG tablet Take on Monday, Wednesday, and Friday only      . ESZOPICLONE 3 MG tablet       . fexofenadine (ALLEGRA) 180 MG tablet TAKE 1 TABLET EVERY DAY  30 tablet  3  . hydroxyurea (HYDREA) 500 MG capsule Take 500 mg by mouth daily. Mon,Wed,Fri take 1 pill. Tues,Thurs, Sat, Sun take 2 pills      . levothyroxine (SYNTHROID, LEVOTHROID) 50 MCG tablet TAKE ONE TABLET EVERY MORNING ON AN EMPTY STOMACH  30 tablet  6  . metoprolol tartrate (LOPRESSOR) 25 MG tablet TAKE ONE TABLET TWICE A DAY  60 tablet  3  . pantoprazole (PROTONIX) 40 MG tablet TAKE ONE TABLET TWICE A DAY  180 tablet  3  . temazepam (RESTORIL) 15 MG capsule Take 1 capsule (15 mg total) by mouth at bedtime as needed for sleep.  30 capsule  3   No facility-administered encounter medications on file as of 11/14/2012.   BP 120/72  Pulse 62  Temp(Src) 97.8  F (36.6 C) (Oral)  Ht 5' 1.75" (1.568 m)  Wt 123 lb (55.792 kg)  BMI 22.69 kg/m2  SpO2 98%  Review of Systems  Constitutional: Negative for fever, chills, appetite change, fatigue and unexpected weight change.  HENT: Positive for hearing loss. Negative for ear pain, congestion, sore throat, trouble swallowing, neck pain, voice change and sinus pressure.   Eyes: Negative for visual disturbance.  Respiratory: Negative for cough, shortness of breath, wheezing and stridor.   Cardiovascular: Negative for chest pain, palpitations and leg swelling.  Gastrointestinal: Negative for nausea, vomiting, abdominal pain, diarrhea, constipation, blood in stool, abdominal distention and anal bleeding.  Genitourinary: Negative for dysuria and flank pain.  Musculoskeletal: Negative for myalgias, arthralgias and gait problem.  Skin: Negative for color change and rash.  Neurological: Negative for dizziness and headaches.  Hematological: Negative for adenopathy. Does not bruise/bleed easily.  Psychiatric/Behavioral: Negative for suicidal ideas, sleep disturbance and dysphoric mood. The patient is not nervous/anxious.        Objective:   Physical Exam  Constitutional: She is oriented to person, place, and time. She appears well-developed and well-nourished. No distress.  HENT:  Head: Normocephalic and atraumatic.  Right Ear: External ear normal. Decreased hearing is noted.  Left Ear: External ear normal. Decreased hearing is noted.  Nose: Nose normal.  Mouth/Throat: Oropharynx is clear and moist. No oropharyngeal exudate.  Eyes: Conjunctivae are normal. Pupils are equal, round, and reactive to light. Right eye exhibits no discharge. Left eye exhibits no discharge. No scleral icterus.  Neck: Normal range of motion. Neck supple. No tracheal deviation present. No thyromegaly present.  Cardiovascular: Normal rate, regular rhythm, normal heart sounds and intact distal pulses.  Exam reveals no gallop and no  friction rub.   No murmur heard. Pulmonary/Chest: Effort normal and breath sounds normal. No accessory muscle usage. Not tachypneic. No respiratory distress. She has no decreased breath sounds. She has no wheezes. She has no rhonchi. She has no rales. She exhibits no tenderness. Right breast exhibits no inverted nipple, no mass, no nipple discharge, no skin change and no tenderness. Left breast exhibits no inverted nipple, no mass, no nipple discharge, no skin change and no tenderness. Breasts are symmetrical.  Abdominal: Soft. Bowel sounds are normal. She exhibits no distension and no mass. There is no tenderness. There is no rebound and no guarding.  Musculoskeletal: Normal range of motion. She exhibits no edema and no tenderness.  Lymphadenopathy:    She has no cervical adenopathy.  Neurological: She is alert and oriented to person, place, and time. She displays no atrophy and no tremor. No cranial nerve deficit or sensory deficit. She exhibits normal muscle tone. Gait (Generalized unsteadiness) abnormal. Coordination normal.  Skin: Skin is warm and dry. No rash noted. She is not diaphoretic. No erythema. No pallor.  Psychiatric: She has a normal mood and affect. Her behavior is normal. Judgment and thought content normal.          Assessment & Plan:

## 2012-11-14 NOTE — Assessment & Plan Note (Signed)
General medical exam including breast exam normal today except as noted for gait instability. Will set up PT for evaluation for gait and strength training for fall prevention. Mammogram to be scheduled in October 2014. Bone density testing ordered. Immunizations are up to date. Appropriate screening performed. Notably, patient has marked hearing loss with little or no improvement with use of hearing aids. Encouraged her to limit driving to daytime only.

## 2012-11-14 NOTE — Assessment & Plan Note (Signed)
BP Readings from Last 3 Encounters:  11/14/12 120/72  08/14/12 144/74  04/12/12 130/70   BP well controlled on current medication. Will continue.

## 2012-11-28 ENCOUNTER — Ambulatory Visit (INDEPENDENT_AMBULATORY_CARE_PROVIDER_SITE_OTHER): Payer: Medicare Other | Admitting: *Deleted

## 2012-11-28 DIAGNOSIS — E538 Deficiency of other specified B group vitamins: Secondary | ICD-10-CM

## 2012-11-28 MED ORDER — CYANOCOBALAMIN 1000 MCG/ML IJ SOLN
1000.0000 ug | Freq: Once | INTRAMUSCULAR | Status: AC
  Administered 2012-11-28: 1000 ug via INTRAMUSCULAR

## 2012-12-05 ENCOUNTER — Ambulatory Visit: Payer: Self-pay | Admitting: Oncology

## 2012-12-05 LAB — CBC CANCER CENTER
Basophil #: 0.1 x10 3/mm (ref 0.0–0.1)
Basophil %: 0.7 %
Eosinophil #: 0.3 x10 3/mm (ref 0.0–0.7)
Eosinophil %: 4.5 %
HCT: 30.5 % — ABNORMAL LOW (ref 35.0–47.0)
Lymphocyte #: 1.3 x10 3/mm (ref 1.0–3.6)
MCHC: 33.2 g/dL (ref 32.0–36.0)
MCV: 120 fL — ABNORMAL HIGH (ref 80–100)
Monocyte #: 0.7 x10 3/mm (ref 0.2–0.9)
Monocyte %: 10.1 %
Neutrophil #: 5 x10 3/mm (ref 1.4–6.5)
Neutrophil %: 67.6 %
Platelet: 782 x10 3/mm — ABNORMAL HIGH (ref 150–440)
RBC: 2.55 10*6/uL — ABNORMAL LOW (ref 3.80–5.20)
WBC: 7.3 x10 3/mm (ref 3.6–11.0)

## 2012-12-05 LAB — BASIC METABOLIC PANEL
Anion Gap: 9 (ref 7–16)
BUN: 27 mg/dL — ABNORMAL HIGH (ref 7–18)
Calcium, Total: 9.1 mg/dL (ref 8.5–10.1)
Co2: 24 mmol/L (ref 21–32)
EGFR (African American): 35 — ABNORMAL LOW
EGFR (Non-African Amer.): 30 — ABNORMAL LOW
Sodium: 134 mmol/L — ABNORMAL LOW (ref 136–145)

## 2012-12-06 ENCOUNTER — Other Ambulatory Visit: Payer: Self-pay | Admitting: Internal Medicine

## 2012-12-06 ENCOUNTER — Telehealth: Payer: Self-pay | Admitting: *Deleted

## 2012-12-06 NOTE — Telephone Encounter (Signed)
Refill Request  Lunesta 3 mg tab   #30   Take one tablet at bedtime

## 2012-12-06 NOTE — Telephone Encounter (Signed)
Prescription has been faxed.

## 2012-12-06 NOTE — Telephone Encounter (Signed)
Fine to fill. 

## 2012-12-08 ENCOUNTER — Encounter: Payer: Self-pay | Admitting: Internal Medicine

## 2012-12-13 ENCOUNTER — Other Ambulatory Visit: Payer: Self-pay | Admitting: Internal Medicine

## 2013-01-01 ENCOUNTER — Ambulatory Visit (INDEPENDENT_AMBULATORY_CARE_PROVIDER_SITE_OTHER): Payer: Medicare Other | Admitting: *Deleted

## 2013-01-01 DIAGNOSIS — Z23 Encounter for immunization: Secondary | ICD-10-CM

## 2013-01-01 DIAGNOSIS — E538 Deficiency of other specified B group vitamins: Secondary | ICD-10-CM

## 2013-01-01 MED ORDER — CYANOCOBALAMIN 1000 MCG/ML IJ SOLN
1000.0000 ug | Freq: Once | INTRAMUSCULAR | Status: AC
  Administered 2013-01-01: 1000 ug via INTRAMUSCULAR

## 2013-01-02 ENCOUNTER — Ambulatory Visit: Payer: Medicare Other

## 2013-01-03 ENCOUNTER — Ambulatory Visit: Payer: Self-pay | Admitting: Oncology

## 2013-01-08 ENCOUNTER — Encounter: Payer: Self-pay | Admitting: Internal Medicine

## 2013-01-16 ENCOUNTER — Ambulatory Visit: Payer: Self-pay | Admitting: Internal Medicine

## 2013-01-17 ENCOUNTER — Ambulatory Visit: Payer: Self-pay | Admitting: Oncology

## 2013-01-17 LAB — URINALYSIS, COMPLETE
Bilirubin,UR: NEGATIVE
Nitrite: NEGATIVE
Ph: 5 (ref 4.5–8.0)
Protein: NEGATIVE
RBC,UR: <1 /HPF (ref 0–5)
Specific Gravity: 1.011 (ref 1.003–1.030)
Squamous Epithelial: 3
WBC UR: 6 /HPF (ref 0–5)

## 2013-01-17 LAB — COMPREHENSIVE METABOLIC PANEL
Albumin: 3.3 g/dL — ABNORMAL LOW (ref 3.4–5.0)
Alkaline Phosphatase: 79 U/L (ref 50–136)
BUN: 23 mg/dL — ABNORMAL HIGH (ref 7–18)
Bilirubin,Total: 0.4 mg/dL (ref 0.2–1.0)
Chloride: 101 mmol/L (ref 98–107)
Creatinine: 1.62 mg/dL — ABNORMAL HIGH (ref 0.60–1.30)
EGFR (African American): 33 — ABNORMAL LOW
Osmolality: 276 (ref 275–301)
Sodium: 136 mmol/L (ref 136–145)

## 2013-01-17 LAB — CBC CANCER CENTER
Basophil #: 0 x10 3/mm (ref 0.0–0.1)
Basophil %: 0.2 %
HCT: 29.2 % — ABNORMAL LOW (ref 35.0–47.0)
HGB: 9.5 g/dL — ABNORMAL LOW (ref 12.0–16.0)
MCHC: 32.7 g/dL (ref 32.0–36.0)
MCV: 118 fL — ABNORMAL HIGH (ref 80–100)
Monocyte %: 9.3 %
Neutrophil %: 76.4 %
RBC: 2.47 10*6/uL — ABNORMAL LOW (ref 3.80–5.20)
RDW: 13.5 % (ref 11.5–14.5)
WBC: 15 x10 3/mm — ABNORMAL HIGH (ref 3.6–11.0)

## 2013-01-22 ENCOUNTER — Telehealth: Payer: Self-pay | Admitting: Internal Medicine

## 2013-01-22 NOTE — Telephone Encounter (Signed)
Spoke with patient, she state she had an ultrasound on her neck which showed some slight narrowing. Dr. Andee Poles told her not to worry about it unless the roaring bothering her. The roar in her ear is the reason for the U/S and that is what is really bothering her. The noise comes and goes but now she is concerned about having all the radiation. Dr. Andee Poles recommended if the roaring the persisted then she should have the MRI and she scheduled but really concerned about having it now and would really like your opinion. Please advise

## 2013-01-22 NOTE — Telephone Encounter (Signed)
Needs visit to discuss

## 2013-01-22 NOTE — Telephone Encounter (Signed)
Pt wanted your opinion on a test she is needing to have by another physician. I couldn't really understand her and her phone has bad reception but she wanted a call from a nurse or Dr. Dan Humphreys. I was going to set her up for an appointment but Dr. Dan Humphreys does not have anything until after her test. Her test is scheduled for 10/3/014 and she wanted Dr. Tilman Neat opinion before then.

## 2013-01-23 NOTE — Telephone Encounter (Signed)
Left message to call back  

## 2013-01-24 ENCOUNTER — Other Ambulatory Visit: Payer: Self-pay | Admitting: Internal Medicine

## 2013-01-24 NOTE — Telephone Encounter (Signed)
Patient scheduled for an appointment on Monday with Dr. Dan Humphreys

## 2013-01-29 ENCOUNTER — Ambulatory Visit (INDEPENDENT_AMBULATORY_CARE_PROVIDER_SITE_OTHER): Payer: Medicare Other | Admitting: Internal Medicine

## 2013-01-29 ENCOUNTER — Encounter: Payer: Self-pay | Admitting: Internal Medicine

## 2013-01-29 VITALS — BP 114/60 | HR 68 | Temp 97.9°F | Wt 121.0 lb

## 2013-01-29 DIAGNOSIS — R05 Cough: Secondary | ICD-10-CM

## 2013-01-29 DIAGNOSIS — D473 Essential (hemorrhagic) thrombocythemia: Secondary | ICD-10-CM

## 2013-01-29 DIAGNOSIS — H9311 Tinnitus, right ear: Secondary | ICD-10-CM | POA: Insufficient documentation

## 2013-01-29 DIAGNOSIS — H9319 Tinnitus, unspecified ear: Secondary | ICD-10-CM

## 2013-01-29 DIAGNOSIS — N390 Urinary tract infection, site not specified: Secondary | ICD-10-CM

## 2013-01-29 DIAGNOSIS — K5900 Constipation, unspecified: Secondary | ICD-10-CM | POA: Insufficient documentation

## 2013-01-29 DIAGNOSIS — I10 Essential (primary) hypertension: Secondary | ICD-10-CM

## 2013-01-29 LAB — POCT URINALYSIS DIPSTICK
Ketones, UA: NEGATIVE
Leukocytes, UA: NEGATIVE
Protein, UA: NEGATIVE
Urobilinogen, UA: 0.2
pH, UA: 5.5

## 2013-01-29 NOTE — Assessment & Plan Note (Signed)
BP Readings from Last 3 Encounters:  01/29/13 114/60  11/14/12 120/72  08/14/12 144/74   Blood pressure well-controlled on current medication. Will continue.

## 2013-01-29 NOTE — Assessment & Plan Note (Signed)
Recent episodes of dry cough, most likely associated with seasonal allergies and allergic rhinitis with postnasal drip. Exam is normal today. Encouraged her to continue with Allegra to see if any improvement. If no improvement, consider referral for allergy testing and repeat chest x-ray.

## 2013-01-29 NOTE — Assessment & Plan Note (Signed)
Chronic mild constipation. Exam normal today. Encouraged her to increase MiraLax to 17 g mixed in a beverage daily. We also discussed potentially using a fleets enema. She will call if symptoms are not improving.

## 2013-01-29 NOTE — Assessment & Plan Note (Signed)
Reviewed recent labs drawn by her oncologist. Note that hydroxyurea dose has been increased. Encouraged her to followup and have repeat labs drawn tomorrow. She will forward these labs to our office.

## 2013-01-29 NOTE — Progress Notes (Signed)
Subjective:    Patient ID: Tanya Rice, female    DOB: 12-28-1926, 79 y.o.   MRN: [id number]  HPI  79YO female with h/o essential thrombocythemia, hypertension presents for acute visit. She has several concerns today.  First, she notes  "roaring" in right ear several months. Right carotid doppler showed minimal narrowing per pt. Seen by Dr. Andee Poles in ENT, who recommended MRI brain. This is scheduled for Thursday. She is concerned about radiation exposure with MRI.  Also concerned about elevated platelets 1256 on recent labs with her oncologist, Dr. Koleen Nimrod. She notes that Hydroxyurea was recently lowered prior to this. She has now had dose increased back to 2 pills daily. Repeat labs scheduled for tomorrow.  Was also recently diagnosed with UTI by her oncologist. Started Cipro and has nearly completed course.  Abdominal pain has resolved. No fever, chills, flank pain.  Over last several weeks, notes chronic non-productive cough. No fever, chills, chest pain, dyspnea. Questions if this may be related to seasonal allergies. Started back on Allegra this week.  Also notes recent issues with constipation. Taking Miralax every 2-3 days, but continues to have hard, formed stools that are difficult to pass. No rectal bleeding, pain. No nausea or vomiting.  Outpatient Encounter Prescriptions as of 01/29/2013  Medication Sig Dispense Refill  . aspirin EC 81 MG tablet Take 81 mg by mouth daily. Take on Monday, Wednesday, and Friday only      . ciprofloxacin (CIPRO) 500 MG tablet       . ESZOPICLONE 3 MG tablet TAKE ONE TABLET AT BEDTIME  30 tablet  3  . fexofenadine (ALLEGRA) 180 MG tablet TAKE 1 TABLET EVERY DAY  30 tablet  3  . fluticasone (FLONASE) 50 MCG/ACT nasal spray       . hydroxyurea (HYDREA) 500 MG capsule Take 500 mg by mouth 2 (two) times daily.       Marland Kitchen levothyroxine (SYNTHROID, LEVOTHROID) 50 MCG tablet TAKE ONE TABLET EVERY MORNING ON AN EMPTY STOMACH  30 tablet  6  . metoprolol  tartrate (LOPRESSOR) 25 MG tablet TAKE ONE TABLET TWICE A DAY  60 tablet  5  . pantoprazole (PROTONIX) 40 MG tablet TAKE ONE TABLET TWICE A DAY  180 tablet  2  . temazepam (RESTORIL) 15 MG capsule Take 1 capsule (15 mg total) by mouth at bedtime as needed for sleep.  30 capsule  3   No facility-administered encounter medications on file as of 01/29/2013.   BP 114/60  Pulse 68  Temp(Src) 97.9 F (36.6 C) (Oral)  Wt 121 lb (54.885 kg)  BMI 22.32 kg/m2  SpO2 99%   Review of Systems  Constitutional: Negative for fever, chills, appetite change, fatigue and unexpected weight change.  HENT: Positive for tinnitus. Negative for congestion, ear discharge, ear pain, sinus pressure, sore throat, trouble swallowing and voice change.   Eyes: Negative for visual disturbance.  Respiratory: Positive for cough. Negative for shortness of breath, wheezing and stridor.   Cardiovascular: Negative for chest pain, palpitations and leg swelling.  Gastrointestinal: Negative for nausea, vomiting, abdominal pain, diarrhea, constipation, blood in stool, abdominal distention and anal bleeding.  Genitourinary: Negative for dysuria and flank pain.  Musculoskeletal: Negative for arthralgias, gait problem, myalgias and neck pain.  Skin: Negative for color change and rash.  Neurological: Negative for dizziness and headaches.  Hematological: Negative for adenopathy. Does not bruise/bleed easily.  Psychiatric/Behavioral: Negative for suicidal ideas, sleep disturbance and dysphoric mood. The patient is not nervous/anxious.  Objective:   Physical Exam  Constitutional: She is oriented to person, place, and time. She appears well-developed and well-nourished. No distress.  HENT:  Head: Normocephalic and atraumatic.  Right Ear: External ear normal.  Left Ear: External ear normal.  Nose: Nose normal.  Mouth/Throat: Oropharynx is clear and moist. No oropharyngeal exudate.  Eyes: Conjunctivae are normal. Pupils are  equal, round, and reactive to light. Right eye exhibits no discharge. Left eye exhibits no discharge. No scleral icterus.  Neck: Normal range of motion. Neck supple. No tracheal deviation present. No thyromegaly present.  Cardiovascular: Normal rate, regular rhythm, normal heart sounds and intact distal pulses.  Exam reveals no gallop and no friction rub.   No murmur heard. Pulmonary/Chest: Effort normal and breath sounds normal. No accessory muscle usage. Not tachypneic. No respiratory distress. She has no decreased breath sounds. She has no wheezes. She has no rhonchi. She has no rales. She exhibits no tenderness.  Abdominal: Soft. Bowel sounds are normal. She exhibits no distension and no mass. There is no tenderness. There is no rebound and no guarding.  Musculoskeletal: Normal range of motion. She exhibits no edema and no tenderness.  Lymphadenopathy:    She has no cervical adenopathy.  Neurological: She is alert and oriented to person, place, and time. No cranial nerve deficit. She exhibits normal muscle tone. Coordination normal.  Skin: Skin is warm and dry. No rash noted. She is not diaphoretic. No erythema. No pallor.  Psychiatric: She has a normal mood and affect. Her behavior is normal. Judgment and thought content normal.          Assessment & Plan:

## 2013-01-29 NOTE — Assessment & Plan Note (Signed)
Recent significant worsening of tinnitus of the right ear. Encouraged her to proceed with MRI as suggested by her ENT physician. We discussed there is no radiation involved with the study.

## 2013-01-29 NOTE — Assessment & Plan Note (Signed)
Recently treated for urinary tract infection by her oncologist. Symptoms have improved. Urinalysis today is normal. Will monitor.

## 2013-01-30 ENCOUNTER — Encounter: Payer: Self-pay | Admitting: Internal Medicine

## 2013-01-31 LAB — CBC CANCER CENTER
Basophil #: 0 x10 3/mm (ref 0.0–0.1)
Basophil %: 0.4 %
Eosinophil %: 3.2 %
HCT: 26.3 % — ABNORMAL LOW (ref 35.0–47.0)
HGB: 8.5 g/dL — ABNORMAL LOW (ref 12.0–16.0)
Lymphocyte #: 1.1 x10 3/mm (ref 1.0–3.6)
Lymphocyte %: 12.3 %
MCH: 37.8 pg — ABNORMAL HIGH (ref 26.0–34.0)
MCV: 117 fL — ABNORMAL HIGH (ref 80–100)
Monocyte %: 10.1 %
RBC: 2.24 10*6/uL — ABNORMAL LOW (ref 3.80–5.20)
RDW: 14.4 % (ref 11.5–14.5)
WBC: 8.8 x10 3/mm (ref 3.6–11.0)

## 2013-01-31 LAB — COMPREHENSIVE METABOLIC PANEL
Alkaline Phosphatase: 62 U/L (ref 50–136)
Anion Gap: 9 (ref 7–16)
BUN: 20 mg/dL — ABNORMAL HIGH (ref 7–18)
Bilirubin,Total: 0.4 mg/dL (ref 0.2–1.0)
Calcium, Total: 8.1 mg/dL — ABNORMAL LOW (ref 8.5–10.1)
Chloride: 96 mmol/L — ABNORMAL LOW (ref 98–107)
Co2: 24 mmol/L (ref 21–32)
Creatinine: 1.58 mg/dL — ABNORMAL HIGH (ref 0.60–1.30)
EGFR (African American): 34 — ABNORMAL LOW
Osmolality: 263 (ref 275–301)
Potassium: 4.3 mmol/L (ref 3.5–5.1)
SGOT(AST): 30 U/L (ref 15–37)
SGPT (ALT): 19 U/L (ref 12–78)
Sodium: 129 mmol/L — ABNORMAL LOW (ref 136–145)
Total Protein: 7.3 g/dL (ref 6.4–8.2)

## 2013-01-31 LAB — IRON AND TIBC
Iron Bind.Cap.(Total): 300 ug/dL (ref 250–450)
Iron Saturation: 15 %
Iron: 45 ug/dL — ABNORMAL LOW (ref 50–170)
Unbound Iron-Bind.Cap.: 255 ug/dL

## 2013-02-01 ENCOUNTER — Ambulatory Visit: Payer: Self-pay | Admitting: Otolaryngology

## 2013-02-01 ENCOUNTER — Ambulatory Visit: Payer: Medicare Other

## 2013-02-01 ENCOUNTER — Ambulatory Visit (INDEPENDENT_AMBULATORY_CARE_PROVIDER_SITE_OTHER): Payer: Medicare Other | Admitting: *Deleted

## 2013-02-01 DIAGNOSIS — E538 Deficiency of other specified B group vitamins: Secondary | ICD-10-CM

## 2013-02-01 MED ORDER — CYANOCOBALAMIN 1000 MCG/ML IJ SOLN
1000.0000 ug | Freq: Once | INTRAMUSCULAR | Status: AC
  Administered 2013-02-01: 1000 ug via INTRAMUSCULAR

## 2013-02-02 LAB — URINE CULTURE

## 2013-02-03 ENCOUNTER — Ambulatory Visit: Payer: Self-pay | Admitting: Oncology

## 2013-02-05 ENCOUNTER — Ambulatory Visit: Payer: Self-pay | Admitting: Oncology

## 2013-02-05 LAB — CBC CANCER CENTER
Basophil #: 0 x10 3/mm (ref 0.0–0.1)
Basophil %: 0.3 %
Eosinophil #: 0.2 x10 3/mm (ref 0.0–0.7)
Eosinophil %: 2.6 %
HCT: 27.6 % — ABNORMAL LOW (ref 35.0–47.0)
Lymphocyte #: 1.2 x10 3/mm (ref 1.0–3.6)
Lymphocyte %: 18.1 %
MCH: 38.6 pg — ABNORMAL HIGH (ref 26.0–34.0)
MCV: 118 fL — ABNORMAL HIGH (ref 80–100)
Monocyte #: 1 x10 3/mm — ABNORMAL HIGH (ref 0.2–0.9)
Monocyte %: 15.3 %
Neutrophil #: 4.1 x10 3/mm (ref 1.4–6.5)
Platelet: 942 x10 3/mm — ABNORMAL HIGH (ref 150–440)
RBC: 2.35 10*6/uL — ABNORMAL LOW (ref 3.80–5.20)
RDW: 14.7 % — ABNORMAL HIGH (ref 11.5–14.5)
WBC: 6.4 x10 3/mm (ref 3.6–11.0)

## 2013-02-06 ENCOUNTER — Encounter: Payer: Self-pay | Admitting: Internal Medicine

## 2013-02-08 ENCOUNTER — Ambulatory Visit (INDEPENDENT_AMBULATORY_CARE_PROVIDER_SITE_OTHER): Payer: Medicare Other | Admitting: Adult Health

## 2013-02-08 ENCOUNTER — Telehealth: Payer: Self-pay | Admitting: Internal Medicine

## 2013-02-08 ENCOUNTER — Encounter: Payer: Self-pay | Admitting: Adult Health

## 2013-02-08 VITALS — BP 138/60 | HR 74 | Temp 98.0°F | Resp 12 | Wt 124.0 lb

## 2013-02-08 DIAGNOSIS — R600 Localized edema: Secondary | ICD-10-CM

## 2013-02-08 DIAGNOSIS — R609 Edema, unspecified: Secondary | ICD-10-CM

## 2013-02-08 NOTE — Assessment & Plan Note (Signed)
Symptoms began yesterday. ? Procrit which patient received on Monday. Common side effect as well as cough. Patient believes that cough was present prior. I will check her kidney function prior to ordering a mild diuretic given renal hx. I will also check BNP.

## 2013-02-08 NOTE — Progress Notes (Signed)
  Subjective:    Patient ID: Tanya Rice, female    DOB: 1927-03-03, 79 y.o.   MRN: [id number]  HPI  Bilateral LE edema that started yesterday. She denies increasing sodium in her diet. She denies taking any new oral medications whether over-the-counter or prescribed. She does reports receiving a procrit injection on Monday.  She has an ongoing cough for several weeks. She was seen by ENT yesterday and was started on a nasal spray and Tessalon. Denies shortness of breath, chest pain. Patient exhibits no pain in bilateral lower extremities.   Current Outpatient Prescriptions on File Prior to Visit  Medication Sig Dispense Refill  . aspirin EC 81 MG tablet Take 81 mg by mouth daily. Take on Monday, Wednesday, and Friday only      . ESZOPICLONE 3 MG tablet TAKE ONE TABLET AT BEDTIME  30 tablet  3  . fexofenadine (ALLEGRA) 180 MG tablet TAKE 1 TABLET EVERY DAY  30 tablet  3  . fluticasone (FLONASE) 50 MCG/ACT nasal spray       . hydroxyurea (HYDREA) 500 MG capsule Take 500 mg by mouth 2 (two) times daily.       Marland Kitchen levothyroxine (SYNTHROID, LEVOTHROID) 50 MCG tablet TAKE ONE TABLET EVERY MORNING ON AN EMPTY STOMACH  30 tablet  6  . metoprolol tartrate (LOPRESSOR) 25 MG tablet TAKE ONE TABLET TWICE A DAY  60 tablet  5  . pantoprazole (PROTONIX) 40 MG tablet TAKE ONE TABLET TWICE A DAY  180 tablet  2  . temazepam (RESTORIL) 15 MG capsule Take 1 capsule (15 mg total) by mouth at bedtime as needed for sleep.  30 capsule  3   No current facility-administered medications on file prior to visit.                                                                                               Review of Systems  Constitutional: Negative.   Respiratory: Positive for cough. Negative for shortness of breath.   Cardiovascular: Positive for leg swelling.  Psychiatric/Behavioral: Negative.        Objective:   Physical Exam  Constitutional: She is oriented to person, place, and time. She appears  well-developed and well-nourished. No distress.  Cardiovascular: Normal rate, regular rhythm and normal heart sounds.  Exam reveals no gallop and no friction rub.   No murmur heard. Pulmonary/Chest: Effort normal and breath sounds normal. No respiratory distress. She has no wheezes. She has no rales.  Musculoskeletal: She exhibits edema.  Trace - 1 bilateral lower extremity edema.  Neurological: She is alert and oriented to person, place, and time.  Skin: Skin is warm and dry.  Psychiatric: She has a normal mood and affect. Her behavior is normal. Judgment and thought content normal.          Assessment & Plan:

## 2013-02-08 NOTE — Progress Notes (Signed)
Pre-visit discussion using our clinic review tool. No additional management support is needed unless otherwise documented below in the visit note.  

## 2013-02-08 NOTE — Telephone Encounter (Signed)
Patient Information:  Caller Name: Karra  Phone: [phone]  Patient: Tanya Rice, Tanya Rice  Gender: Female  DOB: 1926-05-05  Age: 79 Years  PCP: Ronna Polio (Adults only)  Office Follow Up:  Does the office need to follow up with this patient?: No  Instructions For The Office: N/A  RN Note:  Appointment scheduled for 15:45 with Orville Govern, NP.  Symptoms  Reason For Call & Symptoms: Onset 02/07/2014 of swelling in both ankles.  Reviewed Health History In EMR: Yes  Reviewed Medications In EMR: Yes  Reviewed Allergies In EMR: Yes  Reviewed Surgeries / Procedures: Yes  Date of Onset of Symptoms: 02/07/2013  Guideline(s) Used:  Leg Swelling and Edema  Disposition Per Guideline:   See Today in Office  Reason For Disposition Reached:   Moderate swelling of both ankles (e.g., swelling extends up to the knees) AND new onset or worsening  Advice Given:  Call Back If:  Swelling becomes worse  Swelling becomes red or painful to the touch  Calf pain occurs and becomes constant  You become worse.  Patient Will Follow Care Advice:  YES  Appointment Scheduled:  02/08/2013 15:45:00 Appointment Scheduled Provider:  Orville Govern

## 2013-02-09 LAB — BASIC METABOLIC PANEL
CO2: 26 mEq/L (ref 19–32)
Chloride: 96 mEq/L (ref 96–112)
Creatinine, Ser: 1.2 mg/dL (ref 0.4–1.2)
GFR: 45.4 mL/min — ABNORMAL LOW (ref >60.00)
Glucose, Bld: 98 mg/dL (ref 70–99)

## 2013-02-12 ENCOUNTER — Encounter: Payer: Self-pay | Admitting: Internal Medicine

## 2013-02-12 ENCOUNTER — Ambulatory Visit (INDEPENDENT_AMBULATORY_CARE_PROVIDER_SITE_OTHER): Payer: Medicare Other | Admitting: Internal Medicine

## 2013-02-12 VITALS — BP 126/60 | HR 70 | Temp 98.1°F | Wt 122.0 lb

## 2013-02-12 DIAGNOSIS — R1013 Epigastric pain: Secondary | ICD-10-CM | POA: Insufficient documentation

## 2013-02-12 DIAGNOSIS — Z23 Encounter for immunization: Secondary | ICD-10-CM

## 2013-02-12 DIAGNOSIS — R609 Edema, unspecified: Secondary | ICD-10-CM | POA: Insufficient documentation

## 2013-02-12 DIAGNOSIS — R05 Cough: Secondary | ICD-10-CM

## 2013-02-12 LAB — CANCER CENTER HEMOGLOBIN: HGB: 9.2 g/dL — ABNORMAL LOW (ref 12.0–16.0)

## 2013-02-12 MED ORDER — FUROSEMIDE 20 MG PO TABS: 20.0000 mg | ORAL_TABLET | Freq: Every day | ORAL | Status: AC

## 2013-02-12 NOTE — Assessment & Plan Note (Signed)
Recent increased LE edema, cough, BNP mildly elevated, consistent with hypervolemia. Will start Furosemide 20mg  daily x 2 days, then reassess. Plan follow up next week.

## 2013-02-12 NOTE — Patient Instructions (Signed)
Start Furosemide 20mg  daily for the next 2 days.   Follow up next week.

## 2013-02-12 NOTE — Assessment & Plan Note (Signed)
Recent dry cough. Evaluated by ENT and underwent laryngoscopy, which was reportedly normal. Started on Atrovent and Tessalon with minimal improvement. Suspect she may have component of pulmonary edema contributing. Will start Furosemide 20mg  daily x 2 days and reassess. If no improvement, will plan to get CXR.

## 2013-02-12 NOTE — Progress Notes (Signed)
Subjective:    Patient ID: Tanya Rice, female    DOB: Nov 03, 1926, 79 y.o.   MRN: [id number]  HPI 79YO female with h/o essential thrombocythemia, hypertension, hypothyroidism, gastric ulcer with hemorrhage, presents for follow up. Last week, was evaluated in our office for bilateral LE edema. Labs showed elevated BNP. Pt has been elevating legs over the weekend and notes some improvement in LE edema. She also complains of cough over the last few weeks. Cough is described as dry. Was seen by ENT. Had laryngoscopy which she reports was normal. Was started on Atrovent and Tessalon with minimal improvement. No dyspnea, chest pain. No fever, chills. Also notes several weeks of epigastric abdominal pain. Had evaluation with her oncologist including US abdomen which she reports was normal. No nausea, vomiting, change in bowel habits. Compliant with Pantoprazole. Pain does not seem to be affected by food intake.  Outpatient Encounter Prescriptions as of 02/12/2013  Medication Sig  . aspirin EC 81 MG tablet Take 81 mg by mouth daily. Take on Monday, Wednesday, and Friday only  . benzonatate (TESSALON) 100 MG capsule Take 100 mg by mouth 3 (three) times daily as needed for cough.  . ESZOPICLONE 3 MG tablet TAKE ONE TABLET AT BEDTIME  . fexofenadine (ALLEGRA) 180 MG tablet TAKE 1 TABLET EVERY DAY  . hydroxyurea (HYDREA) 500 MG capsule Take 500 mg by mouth 2 (two) times daily.   Marland Kitchen ipratropium (ATROVENT) 0.03 % nasal spray   . levothyroxine (SYNTHROID, LEVOTHROID) 50 MCG tablet TAKE ONE TABLET EVERY MORNING ON AN EMPTY STOMACH  . metoprolol tartrate (LOPRESSOR) 25 MG tablet TAKE ONE TABLET TWICE A DAY  . pantoprazole (PROTONIX) 40 MG tablet TAKE ONE TABLET TWICE A DAY  . temazepam (RESTORIL) 15 MG capsule Take 1 capsule (15 mg total) by mouth at bedtime as needed for sleep.  . fluticasone (FLONASE) 50 MCG/ACT nasal spray   . furosemide (LASIX) 20 MG tablet Take 1 tablet (20 mg total) by mouth daily.    BP 126/60  Pulse 70  Temp(Src) 98.1 F (36.7 C) (Oral)  Wt 122 lb (55.339 kg)  SpO2 96%  Review of Systems  Constitutional: Positive for fatigue. Negative for fever, chills, appetite change and unexpected weight change.  HENT: Negative for congestion, ear pain, sinus pressure, sore throat, trouble swallowing and voice change.   Eyes: Negative for visual disturbance.  Respiratory: Positive for cough. Negative for shortness of breath, wheezing and stridor.   Cardiovascular: Positive for leg swelling. Negative for chest pain and palpitations.  Gastrointestinal: Positive for abdominal pain. Negative for nausea, vomiting, diarrhea, constipation, blood in stool, abdominal distention and anal bleeding.  Genitourinary: Negative for dysuria and flank pain.  Musculoskeletal: Negative for arthralgias, gait problem, myalgias and neck pain.  Skin: Negative for color change and rash.  Neurological: Negative for dizziness and headaches.  Hematological: Negative for adenopathy. Does not bruise/bleed easily.  Psychiatric/Behavioral: Negative for suicidal ideas, sleep disturbance and dysphoric mood. The patient is not nervous/anxious.        Objective:   Physical Exam  Constitutional: She is oriented to person, place, and time. She appears well-developed and well-nourished. No distress.  HENT:  Head: Normocephalic and atraumatic.  Right Ear: External ear normal.  Left Ear: External ear normal.  Nose: Nose normal.  Mouth/Throat: Oropharynx is clear and moist. No oropharyngeal exudate.  Eyes: Conjunctivae are normal. Pupils are equal, round, and reactive to light. Right eye exhibits no discharge. Left eye exhibits no discharge. No scleral icterus.  Neck: Normal range of motion. Neck supple. No tracheal deviation present. No thyromegaly present.  Cardiovascular: Normal rate, regular rhythm, normal heart sounds and intact distal pulses.  Exam reveals no gallop and no friction rub.   No murmur  heard. Pulmonary/Chest: Effort normal. No accessory muscle usage. Not tachypneic. No respiratory distress. She has no decreased breath sounds. She has no wheezes. She has no rhonchi. She has rales (few rales bilateral bases). She exhibits no tenderness.  Abdominal: Soft. Bowel sounds are normal. She exhibits no distension. There is no tenderness.  Musculoskeletal: Normal range of motion. She exhibits edema (trace bilateral LE with pitting around ankles). She exhibits no tenderness.  Lymphadenopathy:    She has no cervical adenopathy.  Neurological: She is alert and oriented to person, place, and time. No cranial nerve deficit. She exhibits normal muscle tone. Coordination normal.  Skin: Skin is warm and dry. No rash noted. She is not diaphoretic. No erythema. No pallor.  Psychiatric: She has a normal mood and affect. Her behavior is normal. Judgment and thought content normal.          Assessment & Plan:

## 2013-02-12 NOTE — Assessment & Plan Note (Addendum)
2-3 weeks of epigastric pain. No improvement with pantoprazole. Will check for H. Pylori with breath test today. If negative and symptoms persistent, will refer back to GI for EGD. Will also request recent notes and US abdomen performed by her oncologist.

## 2013-02-13 LAB — H. PYLORI BREATH TEST: H. pylori Breath Test: NEGATIVE

## 2013-02-19 ENCOUNTER — Encounter: Payer: Self-pay | Admitting: Internal Medicine

## 2013-02-19 ENCOUNTER — Encounter: Payer: Self-pay | Admitting: Podiatry

## 2013-02-19 ENCOUNTER — Telehealth: Payer: Self-pay | Admitting: *Deleted

## 2013-02-19 ENCOUNTER — Ambulatory Visit (INDEPENDENT_AMBULATORY_CARE_PROVIDER_SITE_OTHER): Payer: Medicare Other | Admitting: Internal Medicine

## 2013-02-19 VITALS — BP 120/72 | HR 68 | Temp 98.0°F | Wt 115.0 lb

## 2013-02-19 DIAGNOSIS — D473 Essential (hemorrhagic) thrombocythemia: Secondary | ICD-10-CM

## 2013-02-19 DIAGNOSIS — R1013 Epigastric pain: Secondary | ICD-10-CM

## 2013-02-19 DIAGNOSIS — R05 Cough: Secondary | ICD-10-CM

## 2013-02-19 LAB — CANCER CENTER HEMOGLOBIN: HGB: 8.8 g/dL — ABNORMAL LOW (ref 12.0–16.0)

## 2013-02-19 NOTE — Telephone Encounter (Signed)
Patient's daughter left a message on voicemail Friday requesting lab results for H.Pylori testing and state patient is still coughing.

## 2013-02-19 NOTE — Assessment & Plan Note (Signed)
Persistent cough with occasional dyspnea. No improvement after diuresis with Lasix. ENT evaluation was normal. Exam today is normal. Question if she may have PE given h/o essential thrombocythemia. Will get CT chest with contrast for further evaluation.

## 2013-02-19 NOTE — Progress Notes (Signed)
Pre-visit discussion using our clinic review tool. No additional management support is needed unless otherwise documented below in the visit note.  

## 2013-02-19 NOTE — Telephone Encounter (Signed)
Patient has an appointment to see Dr. Dan Humphreys today at 10:00. Issues can be addressed then.

## 2013-02-19 NOTE — Progress Notes (Signed)
Subjective:    Patient ID: Tanya Rice, female    DOB: 1926/05/14, 79 y.o.   MRN: [id number]  HPI 79 year old female with history of essential thrombocythemia, gastric ulcer with hemorrhage, hypothyroidism presents for followup. Over the last few weeks, she has had persistent epigastric abdominal pain and dry nonproductive cough. Lab evaluation was unremarkable. She also had ultrasound of her abdomen performed by her oncologist which was reportedly normal. Patient and her daughter reports that over the weekend her symptoms of fatigue and dry nonproductive cough became worse. At times, she felt short of breath. She denies any fever, chills, chest pain. She brings recent labs from her oncologist which showed platelet count of 942,000.  Outpatient Encounter Prescriptions as of 02/19/2013  Medication Sig  . aspirin EC 81 MG tablet Take 81 mg by mouth daily. Take on Monday, Wednesday, and Friday only  . benzonatate (TESSALON) 100 MG capsule Take 100 mg by mouth 3 (three) times daily as needed for cough.  . ESZOPICLONE 3 MG tablet TAKE ONE TABLET AT BEDTIME  . fexofenadine (ALLEGRA) 180 MG tablet TAKE 1 TABLET EVERY DAY  . fluticasone (FLONASE) 50 MCG/ACT nasal spray   . furosemide (LASIX) 20 MG tablet Take 1 tablet (20 mg total) by mouth daily.  . hydroxyurea (HYDREA) 500 MG capsule Take 500 mg by mouth 2 (two) times daily.   Marland Kitchen ipratropium (ATROVENT) 0.03 % nasal spray   . levothyroxine (SYNTHROID, LEVOTHROID) 50 MCG tablet TAKE ONE TABLET EVERY MORNING ON AN EMPTY STOMACH  . metoprolol tartrate (LOPRESSOR) 25 MG tablet TAKE ONE TABLET TWICE A DAY  . pantoprazole (PROTONIX) 40 MG tablet TAKE ONE TABLET TWICE A DAY  . temazepam (RESTORIL) 15 MG capsule Take 1 capsule (15 mg total) by mouth at bedtime as needed for sleep.   BP 120/72  Pulse 68  Temp(Src) 98 F (36.7 C) (Oral)  Wt 115 lb (52.164 kg)  SpO2 98%  Review of Systems  Constitutional: Positive for fatigue. Negative for fever,  chills, appetite change and unexpected weight change.  HENT: Negative for congestion, ear pain, sinus pressure, sore throat, trouble swallowing and voice change.   Eyes: Negative for visual disturbance.  Respiratory: Positive for cough (non-productive). Negative for shortness of breath, wheezing and stridor.   Cardiovascular: Negative for chest pain, palpitations and leg swelling.  Gastrointestinal: Positive for abdominal pain (epigsastric). Negative for nausea, vomiting, diarrhea, constipation, blood in stool, abdominal distention and anal bleeding.  Genitourinary: Negative for dysuria and flank pain.  Musculoskeletal: Negative for arthralgias, gait problem, myalgias and neck pain.  Skin: Negative for color change and rash.  Neurological: Negative for dizziness and headaches.  Hematological: Negative for adenopathy. Does not bruise/bleed easily.  Psychiatric/Behavioral: Negative for suicidal ideas, sleep disturbance and dysphoric mood. The patient is not nervous/anxious.        Objective:   Physical Exam  Constitutional: She is oriented to person, place, and time. She appears well-developed and well-nourished. No distress.  HENT:  Head: Normocephalic and atraumatic.  Right Ear: External ear normal.  Left Ear: External ear normal.  Nose: Nose normal.  Mouth/Throat: Oropharynx is clear and moist. No oropharyngeal exudate.  Eyes: Conjunctivae are normal. Pupils are equal, round, and reactive to light. Right eye exhibits no discharge. Left eye exhibits no discharge. No scleral icterus.  Neck: Normal range of motion. Neck supple. No tracheal deviation present. No thyromegaly present.  Cardiovascular: Normal rate, regular rhythm, normal heart sounds and intact distal pulses.  Exam reveals no gallop  and no friction rub.   No murmur heard. Pulmonary/Chest: Effort normal and breath sounds normal. No accessory muscle usage. Not tachypneic. No respiratory distress. She has no decreased breath  sounds. She has no wheezes. She has no rhonchi. She has no rales. She exhibits no tenderness.  Abdominal: Soft. Bowel sounds are normal. She exhibits no distension and no mass. There is no tenderness. There is no rebound and no guarding.  Musculoskeletal: Normal range of motion. She exhibits no edema and no tenderness.  Lymphadenopathy:    She has no cervical adenopathy.  Neurological: She is alert and oriented to person, place, and time. No cranial nerve deficit. She exhibits normal muscle tone. Coordination normal.  Skin: Skin is warm and dry. No rash noted. She is not diaphoretic. No erythema. No pallor.  Psychiatric: She has a normal mood and affect. Her behavior is normal. Judgment and thought content normal.          Assessment & Plan:

## 2013-02-19 NOTE — Assessment & Plan Note (Addendum)
Persistent epigastric abdominal pain. H/o abnormal imaging in the past, with possible mass posterior to IVC, however this seemed to resolve on follow up MRI imaging. Testing for H. Pylori was negative. Exam is normal today. Will get repeat imaging with CT abdomen for evaluation (recent renal function was improved, and pt will be receiving contrast for CT chest today). If CT unrevealing, then plan for referral to GI for upper endoscopy.

## 2013-02-20 ENCOUNTER — Telehealth: Payer: Self-pay | Admitting: *Deleted

## 2013-02-20 ENCOUNTER — Ambulatory Visit: Payer: Self-pay | Admitting: Internal Medicine

## 2013-02-20 NOTE — Telephone Encounter (Signed)
Discussed report of CT with patient over the phone. Will forward results to her oncologist, Dr. Koleen Nimrod, for evaluation. We discussed that she will likely need further evaluation with colonoscopy for possible biopsy of thickened area of her colon.

## 2013-02-20 NOTE — Telephone Encounter (Signed)
Asher Muir called with call report on patient and faxed information as well. Irregular wall thickening of the cecum, terminal ileum highly worrisome, ildefined low density in right hepatic lobe. If patient prove to have colon cancer would consider liver MRI to exclude possibility of mestatic disease. Full report on ledge

## 2013-02-22 ENCOUNTER — Ambulatory Visit: Payer: Medicare Other | Admitting: Internal Medicine

## 2013-02-23 ENCOUNTER — Ambulatory Visit (INDEPENDENT_AMBULATORY_CARE_PROVIDER_SITE_OTHER): Payer: Medicare Other | Admitting: Podiatry

## 2013-02-23 ENCOUNTER — Encounter: Payer: Self-pay | Admitting: Podiatry

## 2013-02-23 VITALS — BP 124/66 | HR 70 | Resp 16 | Ht 62.0 in | Wt 117.0 lb

## 2013-02-23 DIAGNOSIS — M79609 Pain in unspecified limb: Secondary | ICD-10-CM

## 2013-02-23 DIAGNOSIS — B351 Tinea unguium: Secondary | ICD-10-CM

## 2013-02-23 NOTE — Progress Notes (Signed)
  Subjective:    Patient ID: Tanya Rice, female    DOB: [birth year], 79 y.o.   MRN: [id number]  HPI    Review of Systems  Constitutional: Negative.   HENT: Positive for hearing loss and sinus pressure.   Eyes: Negative.   Respiratory: Positive for cough.   Cardiovascular: Negative.   Gastrointestinal: Negative.   Endocrine: Negative.   Genitourinary: Positive for frequency.  Musculoskeletal:       Difficulty walking  Skin: Negative.   Allergic/Immunologic: Positive for food allergies.  Neurological: Positive for dizziness.  Hematological: Bruises/bleeds easily.       Slow to heal  Psychiatric/Behavioral: Negative.        Objective:   Physical Exam        Assessment & Plan:

## 2013-02-23 NOTE — Progress Notes (Signed)
Subjective:     Patient ID: Tanya Rice, female   DOB: 1926/05/05, 79 y.o.   MRN: [id number]  HPI patient is found to have nail disease with thickness 1-5 of both feet better thick sore and impossible for her to cut   Review of Systems     Objective:   Physical Exam Neurovascular status intact with thick toenail disease 1-5 both feet    Assessment:     Mycotic nail infection with pain 1-5 both the    Plan:     Debridement of nailbeds 1-5 both feet with no iatrogenic bleeding noted

## 2013-02-26 ENCOUNTER — Telehealth: Payer: Self-pay | Admitting: Internal Medicine

## 2013-02-26 ENCOUNTER — Ambulatory Visit: Payer: Self-pay | Admitting: Gastroenterology

## 2013-02-26 NOTE — Telephone Encounter (Signed)
[phone] home Tanya Rice daughter Tanya Rice came in today   She dropped off her gi results.  She needed to see surgeon and dr Katrinka Blazing was going to come in and talk to her today but Tanya Rice wanted to talk to dr ely.  Dr Michela Pitcher cannot see her until 12/9.  She wanted to know what to do.  Dr Bluford Kaufmann said colon was partially closed and she wanted her to see surgeon ASAP,  She needs to know what to do about her meds

## 2013-02-27 LAB — CBC CANCER CENTER
Basophil #: 0 x10 3/mm (ref 0.0–0.1)
Eosinophil #: 0.1 x10 3/mm (ref 0.0–0.7)
Eosinophil %: 3.4 %
HCT: 27.1 % — ABNORMAL LOW (ref 35.0–47.0)
HGB: 8.9 g/dL — ABNORMAL LOW (ref 12.0–16.0)
Lymphocyte #: 0.9 x10 3/mm — ABNORMAL LOW (ref 1.0–3.6)
MCHC: 32.6 g/dL (ref 32.0–36.0)
MCV: 120 fL — ABNORMAL HIGH (ref 80–100)
Platelet: 244 x10 3/mm (ref 150–440)
RBC: 2.27 10*6/uL — ABNORMAL LOW (ref 3.80–5.20)
WBC: 3.7 x10 3/mm (ref 3.6–11.0)

## 2013-02-27 LAB — COMPREHENSIVE METABOLIC PANEL
Albumin: 3 g/dL — ABNORMAL LOW (ref 3.4–5.0)
Alkaline Phosphatase: 50 U/L
Anion Gap: 7 (ref 7–16)
Bilirubin,Total: 0.4 mg/dL (ref 0.2–1.0)
Calcium, Total: 8.8 mg/dL (ref 8.5–10.1)
Chloride: 102 mmol/L (ref 98–107)
Co2: 26 mmol/L (ref 21–32)
Creatinine: 1.33 mg/dL — ABNORMAL HIGH (ref 0.60–1.30)
EGFR (African American): 42 — ABNORMAL LOW
Glucose: 93 mg/dL (ref 65–99)
Osmolality: 268 (ref 275–301)
Potassium: 3.8 mmol/L (ref 3.5–5.1)
SGOT(AST): 24 U/L (ref 15–37)
SGPT (ALT): 15 U/L (ref 12–78)
Sodium: 135 mmol/L — ABNORMAL LOW (ref 136–145)
Total Protein: 6.7 g/dL (ref 6.4–8.2)

## 2013-02-27 NOTE — Telephone Encounter (Signed)
Let's plan to make a follow up with her next month.

## 2013-02-27 NOTE — Telephone Encounter (Signed)
Daughter Kriste Basque left message on voicemail stating her mother just saw Dr. Vickey Huger and he is handling everything. However she would like to know when does she need to been in a follow up by you.

## 2013-02-27 NOTE — Telephone Encounter (Signed)
Left message to call back  

## 2013-02-28 ENCOUNTER — Ambulatory Visit: Payer: Self-pay | Admitting: Surgery

## 2013-02-28 LAB — CBC WITH DIFFERENTIAL/PLATELET
Basophil #: 0 10*3/uL (ref 0.0–0.1)
Basophil %: 0.4 %
Eosinophil #: 0.1 10*3/uL (ref 0.0–0.7)
Eosinophil %: 2.6 %
HCT: 26.9 % — ABNORMAL LOW (ref 35.0–47.0)
Lymphocyte #: 0.8 10*3/uL — ABNORMAL LOW (ref 1.0–3.6)
Lymphocyte %: 14.6 %
MCHC: 33.2 g/dL (ref 32.0–36.0)
MCV: 118 fL — ABNORMAL HIGH (ref 80–100)
Neutrophil #: 3.7 10*3/uL (ref 1.4–6.5)
Neutrophil %: 68.6 %
Platelet: 257 10*3/uL (ref 150–440)

## 2013-02-28 LAB — BASIC METABOLIC PANEL
Chloride: 101 mmol/L (ref 98–107)
Creatinine: 1.17 mg/dL (ref 0.60–1.30)
EGFR (African American): 50 — ABNORMAL LOW
Glucose: 86 mg/dL (ref 65–99)
Potassium: 3.9 mmol/L (ref 3.5–5.1)
Sodium: 133 mmol/L — ABNORMAL LOW (ref 136–145)

## 2013-02-28 NOTE — Telephone Encounter (Signed)
Appointment scheduled.

## 2013-03-05 ENCOUNTER — Inpatient Hospital Stay: Payer: Self-pay | Admitting: Surgery

## 2013-03-05 ENCOUNTER — Ambulatory Visit: Payer: Self-pay | Admitting: Oncology

## 2013-03-05 ENCOUNTER — Encounter: Payer: Self-pay | Admitting: Internal Medicine

## 2013-03-05 LAB — PROTIME-INR: Prothrombin Time: 13.9 secs (ref 11.5–14.7)

## 2013-03-05 LAB — HEMOGLOBIN: HGB: 7.3 g/dL — ABNORMAL LOW (ref 12.0–16.0)

## 2013-03-05 LAB — HEMATOCRIT: HCT: 22.8 % — ABNORMAL LOW (ref 35.0–47.0)

## 2013-03-06 ENCOUNTER — Ambulatory Visit: Payer: Medicare Other

## 2013-03-06 LAB — CBC WITH DIFFERENTIAL/PLATELET
Basophil #: 0 10*3/uL (ref 0.0–0.1)
Eosinophil %: 0 %
HCT: 28 % — ABNORMAL LOW (ref 35.0–47.0)
HGB: 9.4 g/dL — ABNORMAL LOW (ref 12.0–16.0)
Lymphocyte %: 7.4 %
MCH: 33.6 pg (ref 26.0–34.0)
Monocyte #: 1.7 x10 3/mm — ABNORMAL HIGH (ref 0.2–0.9)
Monocyte %: 11.8 %
Neutrophil #: 11.6 10*3/uL — ABNORMAL HIGH (ref 1.4–6.5)
Platelet: 395 10*3/uL (ref 150–440)
RBC: 2.81 10*6/uL — ABNORMAL LOW (ref 3.80–5.20)
RDW: 27.5 % — ABNORMAL HIGH (ref 11.5–14.5)
WBC: 14.3 10*3/uL — ABNORMAL HIGH (ref 3.6–11.0)

## 2013-03-06 LAB — BASIC METABOLIC PANEL
Anion Gap: 10 (ref 7–16)
BUN: 14 mg/dL (ref 7–18)
Calcium, Total: 7.7 mg/dL — ABNORMAL LOW (ref 8.5–10.1)
Co2: 20 mmol/L — ABNORMAL LOW (ref 21–32)
Creatinine: 1.9 mg/dL — ABNORMAL HIGH (ref 0.60–1.30)
EGFR (African American): 28 — ABNORMAL LOW
EGFR (Non-African Amer.): 24 — ABNORMAL LOW
Glucose: 152 mg/dL — ABNORMAL HIGH (ref 65–99)
Osmolality: 262 (ref 275–301)
Sodium: 129 mmol/L — ABNORMAL LOW (ref 136–145)

## 2013-03-07 LAB — BASIC METABOLIC PANEL
Anion Gap: 7 (ref 7–16)
Anion Gap: 8 (ref 7–16)
BUN: 18 mg/dL (ref 7–18)
Calcium, Total: 7.8 mg/dL — ABNORMAL LOW (ref 8.5–10.1)
Calcium, Total: 7.8 mg/dL — ABNORMAL LOW (ref 8.5–10.1)
Chloride: 105 mmol/L (ref 98–107)
Chloride: 106 mmol/L (ref 98–107)
Co2: 19 mmol/L — ABNORMAL LOW (ref 21–32)
Co2: 17 mmol/L — ABNORMAL LOW (ref 21–32)
Creatinine: 2.82 mg/dL — ABNORMAL HIGH (ref 0.60–1.30)
EGFR (Non-African Amer.): 16 — ABNORMAL LOW
Glucose: 117 mg/dL — ABNORMAL HIGH (ref 65–99)
Osmolality: 270 (ref 275–301)
Osmolality: 266 (ref 275–301)
Potassium: 5.9 mmol/L — ABNORMAL HIGH (ref 3.5–5.1)
Sodium: 131 mmol/L — ABNORMAL LOW (ref 136–145)
Sodium: 131 mmol/L — ABNORMAL LOW (ref 136–145)

## 2013-03-07 LAB — CBC WITH DIFFERENTIAL/PLATELET
Bands: 1 %
HCT: 26.7 % — ABNORMAL LOW (ref 35.0–47.0)
HGB: 8.8 g/dL — ABNORMAL LOW (ref 12.0–16.0)
Lymphocytes: 10 %
MCV: 102 fL — ABNORMAL HIGH (ref 80–100)
Metamyelocyte: 2 %
Monocytes: 8 %
Myelocyte: 1 %
Platelet: 480 10*3/uL — ABNORMAL HIGH (ref 150–440)
RBC: 2.63 10*6/uL — ABNORMAL LOW (ref 3.80–5.20)
RDW: 27.7 % — ABNORMAL HIGH (ref 11.5–14.5)
Segmented Neutrophils: 78 %
WBC: 16.8 10*3/uL — ABNORMAL HIGH (ref 3.6–11.0)

## 2013-03-08 LAB — CBC WITH DIFFERENTIAL/PLATELET
HCT: 26.2 % — ABNORMAL LOW (ref 35.0–47.0)
Metamyelocyte: 3 %
Monocytes: 4 %
Myelocyte: 1 %
Platelet: 534 10*3/uL — ABNORMAL HIGH (ref 150–440)
RDW: 26.2 % — ABNORMAL HIGH (ref 11.5–14.5)
Segmented Neutrophils: 84 %
WBC: 13.4 10*3/uL — ABNORMAL HIGH (ref 3.6–11.0)

## 2013-03-08 LAB — BASIC METABOLIC PANEL
Anion Gap: 5 — ABNORMAL LOW (ref 7–16)
BUN: 22 mg/dL — ABNORMAL HIGH (ref 7–18)
Calcium, Total: 7.9 mg/dL — ABNORMAL LOW (ref 8.5–10.1)
Chloride: 108 mmol/L — ABNORMAL HIGH (ref 98–107)
EGFR (Non-African Amer.): 13 — ABNORMAL LOW

## 2013-03-08 LAB — COMPREHENSIVE METABOLIC PANEL
Alkaline Phosphatase: 53 U/L
Bilirubin,Total: 0.4 mg/dL (ref 0.2–1.0)
SGOT(AST): 25 U/L (ref 15–37)
SGPT (ALT): 11 U/L — ABNORMAL LOW (ref 12–78)
Total Protein: 5.7 g/dL — ABNORMAL LOW (ref 6.4–8.2)

## 2013-03-08 LAB — PATHOLOGY REPORT

## 2013-03-09 LAB — BASIC METABOLIC PANEL
Calcium, Total: 8.1 mg/dL — ABNORMAL LOW (ref 8.5–10.1)
Chloride: 110 mmol/L — ABNORMAL HIGH (ref 98–107)
Creatinine: 3.47 mg/dL — ABNORMAL HIGH (ref 0.60–1.30)
Glucose: 105 mg/dL — ABNORMAL HIGH (ref 65–99)
Osmolality: 275 (ref 275–301)
Potassium: 4.7 mmol/L (ref 3.5–5.1)

## 2013-03-09 LAB — CBC WITH DIFFERENTIAL/PLATELET
Eosinophil: 4 %
MCH: 33.9 pg (ref 26.0–34.0)
MCHC: 31.9 g/dL — ABNORMAL LOW (ref 32.0–36.0)
MCV: 106 fL — ABNORMAL HIGH (ref 80–100)
Metamyelocyte: 2 %
Segmented Neutrophils: 78 %

## 2013-03-10 LAB — CBC WITH DIFFERENTIAL/PLATELET
Basophil: 1 %
HCT: 29.5 % — ABNORMAL LOW (ref 35.0–47.0)
MCH: 33.2 pg (ref 26.0–34.0)
Platelet: 688 10*3/uL — ABNORMAL HIGH (ref 150–440)
Segmented Neutrophils: 88 %

## 2013-03-10 LAB — BASIC METABOLIC PANEL
Anion Gap: 5 — ABNORMAL LOW (ref 7–16)
BUN: 27 mg/dL — ABNORMAL HIGH (ref 7–18)
Calcium, Total: 8.2 mg/dL — ABNORMAL LOW (ref 8.5–10.1)
Co2: 20 mmol/L — ABNORMAL LOW (ref 21–32)
Creatinine: 3.59 mg/dL — ABNORMAL HIGH (ref 0.60–1.30)
EGFR (African American): 13 — ABNORMAL LOW
EGFR (Non-African Amer.): 11 — ABNORMAL LOW
Glucose: 143 mg/dL — ABNORMAL HIGH (ref 65–99)
Osmolality: 276 (ref 275–301)
Potassium: 4.7 mmol/L (ref 3.5–5.1)
Sodium: 134 mmol/L — ABNORMAL LOW (ref 136–145)

## 2013-03-11 LAB — CBC WITH DIFFERENTIAL/PLATELET
HCT: 26.7 % — ABNORMAL LOW (ref 35.0–47.0)
HGB: 8.8 g/dL — ABNORMAL LOW (ref 12.0–16.0)
Lymphocytes: 5 %
MCHC: 32.9 g/dL (ref 32.0–36.0)
Myelocyte: 1 %
NRBC/100 WBC: 1 /
RBC: 2.58 10*6/uL — ABNORMAL LOW (ref 3.80–5.20)
RDW: 25.8 % — ABNORMAL HIGH (ref 11.5–14.5)
WBC: 16.3 10*3/uL — ABNORMAL HIGH (ref 3.6–11.0)

## 2013-03-11 LAB — BASIC METABOLIC PANEL
Chloride: 109 mmol/L — ABNORMAL HIGH (ref 98–107)
Creatinine: 3.42 mg/dL — ABNORMAL HIGH (ref 0.60–1.30)
EGFR (African American): 14 — ABNORMAL LOW
EGFR (Non-African Amer.): 12 — ABNORMAL LOW
Osmolality: 280 (ref 275–301)
Potassium: 4.2 mmol/L (ref 3.5–5.1)
Sodium: 136 mmol/L (ref 136–145)

## 2013-03-12 ENCOUNTER — Encounter: Payer: Self-pay | Admitting: Internal Medicine

## 2013-03-12 LAB — CBC WITH DIFFERENTIAL/PLATELET
HGB: 8.5 g/dL — ABNORMAL LOW (ref 12.0–16.0)
Lymphocytes: 12 %
MCH: 34 pg (ref 26.0–34.0)
MCHC: 33.1 g/dL (ref 32.0–36.0)
MCV: 103 fL — ABNORMAL HIGH (ref 80–100)
Monocytes: 4 %
RBC: 2.49 10*6/uL — ABNORMAL LOW (ref 3.80–5.20)
WBC: 15.7 10*3/uL — ABNORMAL HIGH (ref 3.6–11.0)

## 2013-03-12 LAB — RENAL FUNCTION PANEL
Albumin: 2.2 g/dL — ABNORMAL LOW (ref 3.4–5.0)
Anion Gap: 12 (ref 7–16)
BUN: 32 mg/dL — ABNORMAL HIGH (ref 7–18)
Calcium, Total: 8.2 mg/dL — ABNORMAL LOW (ref 8.5–10.1)
Co2: 15 mmol/L — ABNORMAL LOW (ref 21–32)
EGFR (African American): 14 — ABNORMAL LOW
EGFR (Non-African Amer.): 12 — ABNORMAL LOW
Glucose: 135 mg/dL — ABNORMAL HIGH (ref 65–99)
Phosphorus: 2.9 mg/dL (ref 2.5–4.9)

## 2013-03-13 LAB — CBC WITH DIFFERENTIAL/PLATELET
Basophil #: 0 10*3/uL (ref 0.0–0.1)
Eosinophil #: 0 10*3/uL (ref 0.0–0.7)
Eosinophil %: 0.1 %
HGB: 8.2 g/dL — ABNORMAL LOW (ref 12.0–16.0)
Lymphocyte #: 0.8 10*3/uL — ABNORMAL LOW (ref 1.0–3.6)
Lymphocyte %: 8.2 %
MCV: 102 fL — ABNORMAL HIGH (ref 80–100)
Monocyte #: 0.3 x10 3/mm (ref 0.2–0.9)
Monocyte %: 3.6 %
Neutrophil %: 87.9 %
RDW: 26.4 % — ABNORMAL HIGH (ref 11.5–14.5)
WBC: 9.5 10*3/uL (ref 3.6–11.0)

## 2013-03-13 LAB — BASIC METABOLIC PANEL
Anion Gap: 8 (ref 7–16)
BUN: 36 mg/dL — ABNORMAL HIGH (ref 7–18)
Creatinine: 3.1 mg/dL — ABNORMAL HIGH (ref 0.60–1.30)
EGFR (African American): 15 — ABNORMAL LOW
Osmolality: 280 (ref 275–301)
Potassium: 3.7 mmol/L (ref 3.5–5.1)
Sodium: 135 mmol/L — ABNORMAL LOW (ref 136–145)

## 2013-03-14 LAB — CBC WITH DIFFERENTIAL/PLATELET
Eosinophil %: 0.1 %
HGB: 8 g/dL — ABNORMAL LOW (ref 12.0–16.0)
Lymphocyte %: 6.1 %
MCH: 33.5 pg (ref 26.0–34.0)
MCHC: 33.3 g/dL (ref 32.0–36.0)
Monocyte %: 5.7 %
Neutrophil #: 11.1 10*3/uL — ABNORMAL HIGH (ref 1.4–6.5)
Neutrophil %: 87.8 %
Platelet: 471 10*3/uL — ABNORMAL HIGH (ref 150–440)
RBC: 2.38 10*6/uL — ABNORMAL LOW (ref 3.80–5.20)
RDW: 25.5 % — ABNORMAL HIGH (ref 11.5–14.5)

## 2013-03-14 LAB — BASIC METABOLIC PANEL
Anion Gap: 9 (ref 7–16)
Chloride: 109 mmol/L — ABNORMAL HIGH (ref 98–107)
Co2: 17 mmol/L — ABNORMAL LOW (ref 21–32)
Creatinine: 2.69 mg/dL — ABNORMAL HIGH (ref 0.60–1.30)
EGFR (African American): 18 — ABNORMAL LOW
Potassium: 3.4 mmol/L — ABNORMAL LOW (ref 3.5–5.1)

## 2013-03-15 LAB — BASIC METABOLIC PANEL
Anion Gap: 8 (ref 7–16)
Calcium, Total: 8.3 mg/dL — ABNORMAL LOW (ref 8.5–10.1)
Chloride: 106 mmol/L (ref 98–107)
Co2: 19 mmol/L — ABNORMAL LOW (ref 21–32)
EGFR (African American): 21 — ABNORMAL LOW
Glucose: 114 mg/dL — ABNORMAL HIGH (ref 65–99)
Osmolality: 274 (ref 275–301)

## 2013-03-16 ENCOUNTER — Encounter: Payer: Self-pay | Admitting: Internal Medicine

## 2013-03-19 ENCOUNTER — Telehealth: Payer: Self-pay | Admitting: Family Medicine

## 2013-03-19 ENCOUNTER — Inpatient Hospital Stay: Payer: Self-pay | Admitting: Internal Medicine

## 2013-03-19 LAB — COMPREHENSIVE METABOLIC PANEL
Albumin: 1.4 g/dL — ABNORMAL LOW (ref 3.4–5.0)
Alkaline Phosphatase: 114 U/L
Anion Gap: 12 (ref 7–16)
BUN: 61 mg/dL — ABNORMAL HIGH (ref 7–18)
Bilirubin,Total: 3.3 mg/dL — ABNORMAL HIGH (ref 0.2–1.0)
Calcium, Total: 9.2 mg/dL (ref 8.5–10.1)
Co2: 15 mmol/L — ABNORMAL LOW (ref 21–32)
Creatinine: 3.09 mg/dL — ABNORMAL HIGH (ref 0.60–1.30)
EGFR (African American): 15 — ABNORMAL LOW
EGFR (Non-African Amer.): 13 — ABNORMAL LOW
Glucose: 72 mg/dL (ref 65–99)
Osmolality: 275 (ref 275–301)
Potassium: 4.7 mmol/L (ref 3.5–5.1)
SGPT (ALT): 13 U/L (ref 12–78)
Sodium: 129 mmol/L — ABNORMAL LOW (ref 136–145)

## 2013-03-19 LAB — URINALYSIS, COMPLETE
Glucose,UR: NEGATIVE mg/dL (ref 0–75)
Hyaline Cast: 7
Ketone: NEGATIVE
Leukocyte Esterase: NEGATIVE
Nitrite: NEGATIVE
Protein: 30
RBC,UR: 45 /HPF (ref 0–5)

## 2013-03-19 LAB — CBC
HGB: 9.7 g/dL — ABNORMAL LOW (ref 12.0–16.0)
MCH: 31.7 pg (ref 26.0–34.0)
MCV: 101 fL — ABNORMAL HIGH (ref 80–100)
Platelet: 435 10*3/uL (ref 150–440)
RBC: 3.06 10*6/uL — ABNORMAL LOW (ref 3.80–5.20)
RDW: 26 % — ABNORMAL HIGH (ref 11.5–14.5)
WBC: 48.2 10*3/uL — ABNORMAL HIGH (ref 3.6–11.0)

## 2013-03-19 LAB — TSH: Thyroid Stimulating Horm: 4.09 u[IU]/mL

## 2013-03-19 LAB — LIPASE, BLOOD: Lipase: 57 U/L — ABNORMAL LOW (ref 73–393)

## 2013-03-19 LAB — BILIRUBIN, DIRECT: Bilirubin, Direct: 2.5 mg/dL — ABNORMAL HIGH (ref 0.00–0.20)

## 2013-03-19 NOTE — Telephone Encounter (Signed)
[facility] she state patient was sent to the ED to be evaluated.

## 2013-03-19 NOTE — Telephone Encounter (Signed)
Agree with evaluation in the ED. ?

## 2013-03-19 NOTE — Telephone Encounter (Signed)
[facility] Triage Call Report Triage Record Num: [id number] Operator: Rebeca Allegra Patient Name: Tanya Rice Call Date & Time: 03/18/2013 10:22:50PM Patient Phone: [phone] PCP: [facility]ide Patient Gender: Female PCP Fax : [phone] Patient DOB: 01-25-1927 Practice Name: [facility] Reason for Call: Caller: Kathy/LPN; PCP: [facility] Abide ([facility]); CB#: [phone]; Call regarding Post Op; Pt s/p tumor removal from her abdomen. Olegario Messier reports increased edema, confusion and decreased u/o. Olegario Messier also reports new onset of Jaundice. 03/18/13 T98.3, HR: 58 and irregular, RR: 20, BP: 110/60, Sao2 92% with 1.5L -placed 03/18/13 1500, for Sao2 in the 80's. Emergent symptom of "Signs of dehydration". Advised to have pt seen in the ER. Protocol(s) Used: Postoperative Problems Recommended Outcome per Protocol: See ED Immediately Reason for Outcome: Signs of dehydration Care Advice: ~ 03/18/2013 10:31:31PM Page 1 of 1 [id number]

## 2013-03-20 DIAGNOSIS — I319 Disease of pericardium, unspecified: Secondary | ICD-10-CM

## 2013-03-20 LAB — URINE CULTURE

## 2013-03-20 LAB — HEPATIC FUNCTION PANEL A (ARMC)
Albumin: 1.9 g/dL — ABNORMAL LOW (ref 3.4–5.0)
Bilirubin,Total: 3 mg/dL — ABNORMAL HIGH (ref 0.2–1.0)
SGOT(AST): 38 U/L — ABNORMAL HIGH (ref 15–37)
Total Protein: 5.3 g/dL — ABNORMAL LOW (ref 6.4–8.2)

## 2013-03-20 LAB — CO2, TOTAL: Co2: 19 mmol/L — ABNORMAL LOW (ref 21–32)

## 2013-03-21 LAB — CBC WITH DIFFERENTIAL/PLATELET
Bands: 9 %
HCT: 25.4 % — ABNORMAL LOW (ref 35.0–47.0)
HGB: 8.2 g/dL — ABNORMAL LOW (ref 12.0–16.0)
Lymphocytes: 10 %
MCH: 31.4 pg (ref 26.0–34.0)
MCHC: 32.1 g/dL (ref 32.0–36.0)
Metamyelocyte: 1 %
Monocytes: 16 %
NRBC/100 WBC: 15 /
Other Cells Blood: 7
Platelet: 242 10*3/uL (ref 150–440)
Segmented Neutrophils: 56 %
WBC: 28.4 10*3/uL — ABNORMAL HIGH (ref 3.6–11.0)

## 2013-03-21 LAB — COMPREHENSIVE METABOLIC PANEL
Anion Gap: 12 (ref 7–16)
BUN: 76 mg/dL — ABNORMAL HIGH (ref 7–18)
Chloride: 100 mmol/L (ref 98–107)
Co2: 19 mmol/L — ABNORMAL LOW (ref 21–32)
Creatinine: 3.15 mg/dL — ABNORMAL HIGH (ref 0.60–1.30)
EGFR (African American): 15 — ABNORMAL LOW
EGFR (Non-African Amer.): 13 — ABNORMAL LOW
Glucose: 103 mg/dL — ABNORMAL HIGH (ref 65–99)
Osmolality: 286 (ref 275–301)
Potassium: 3.7 mmol/L (ref 3.5–5.1)
SGPT (ALT): 11 U/L — ABNORMAL LOW (ref 12–78)
Sodium: 131 mmol/L — ABNORMAL LOW (ref 136–145)
Total Protein: 5.4 g/dL — ABNORMAL LOW (ref 6.4–8.2)

## 2013-03-21 LAB — MAGNESIUM: Magnesium: 2.2 mg/dL

## 2013-03-21 LAB — PHOSPHORUS: Phosphorus: 3.6 mg/dL (ref 2.5–4.9)

## 2013-03-22 LAB — CBC WITH DIFFERENTIAL/PLATELET
Bands: 25 %
HGB: 7.8 g/dL — ABNORMAL LOW (ref 12.0–16.0)
Lymphocytes: 3 %
MCH: 31.7 pg (ref 26.0–34.0)
MCHC: 32 g/dL (ref 32.0–36.0)
Monocytes: 12 %
NRBC/100 WBC: 16 /
Platelet: 133 10*3/uL — ABNORMAL LOW (ref 150–440)
RBC: 2.46 10*6/uL — ABNORMAL LOW (ref 3.80–5.20)
RDW: 25.6 % — ABNORMAL HIGH (ref 11.5–14.5)
WBC: 43.4 10*3/uL — ABNORMAL HIGH (ref 3.6–11.0)

## 2013-03-22 LAB — BASIC METABOLIC PANEL
Anion Gap: 11 (ref 7–16)
BUN: 79 mg/dL — ABNORMAL HIGH (ref 7–18)
Chloride: 103 mmol/L (ref 98–107)
Co2: 20 mmol/L — ABNORMAL LOW (ref 21–32)
EGFR (African American): 14 — ABNORMAL LOW
EGFR (Non-African Amer.): 12 — ABNORMAL LOW

## 2013-03-22 LAB — PHOSPHORUS: Phosphorus: 3.2 mg/dL (ref 2.5–4.9)

## 2013-03-22 LAB — POTASSIUM: Potassium: 3.2 mmol/L — ABNORMAL LOW (ref 3.5–5.1)

## 2013-03-23 LAB — CBC WITH DIFFERENTIAL/PLATELET
Bands: 3 %
HCT: 25.3 % — ABNORMAL LOW (ref 35.0–47.0)
HGB: 8.1 g/dL — ABNORMAL LOW (ref 12.0–16.0)
Lymphocytes: 9 %
MCH: 31.7 pg (ref 26.0–34.0)
MCHC: 32.1 g/dL (ref 32.0–36.0)
MCV: 99 fL (ref 80–100)
Metamyelocyte: 4 %
Myelocyte: 4 %
NRBC/100 WBC: 9 /
RBC: 2.56 10*6/uL — ABNORMAL LOW (ref 3.80–5.20)
RDW: 25.8 % — ABNORMAL HIGH (ref 11.5–14.5)
Segmented Neutrophils: 73 %
WBC: 52.4 10*3/uL — ABNORMAL HIGH (ref 3.6–11.0)

## 2013-03-23 LAB — RENAL FUNCTION PANEL
Calcium, Total: 8.4 mg/dL — ABNORMAL LOW (ref 8.5–10.1)
Chloride: 105 mmol/L (ref 98–107)
Creatinine: 3.14 mg/dL — ABNORMAL HIGH (ref 0.60–1.30)
EGFR (African American): 15 — ABNORMAL LOW
EGFR (Non-African Amer.): 13 — ABNORMAL LOW
Osmolality: 302 (ref 275–301)
Sodium: 136 mmol/L (ref 136–145)

## 2013-03-24 ENCOUNTER — Ambulatory Visit: Payer: Self-pay | Admitting: Oncology

## 2013-03-27 ENCOUNTER — Ambulatory Visit: Payer: Medicare Other | Admitting: Internal Medicine

## 2013-04-05 ENCOUNTER — Ambulatory Visit: Payer: Self-pay | Admitting: Oncology

## 2013-04-05 DEATH — deceased

## 2013-05-25 ENCOUNTER — Ambulatory Visit: Payer: Medicare Other | Admitting: Podiatry

## 2014-01-25 IMAGING — CR DG CHEST 2V
1 series · 2 of 2 positions shown · non-contrast
Comparison: 07/29/2011.

CLINICAL DATA: Short of breath.

EXAM:
CHEST  2 VIEW

[Series 1: x chest ap · 0.14mm/px · 2 of 2 slices shown]
[im 1/2]
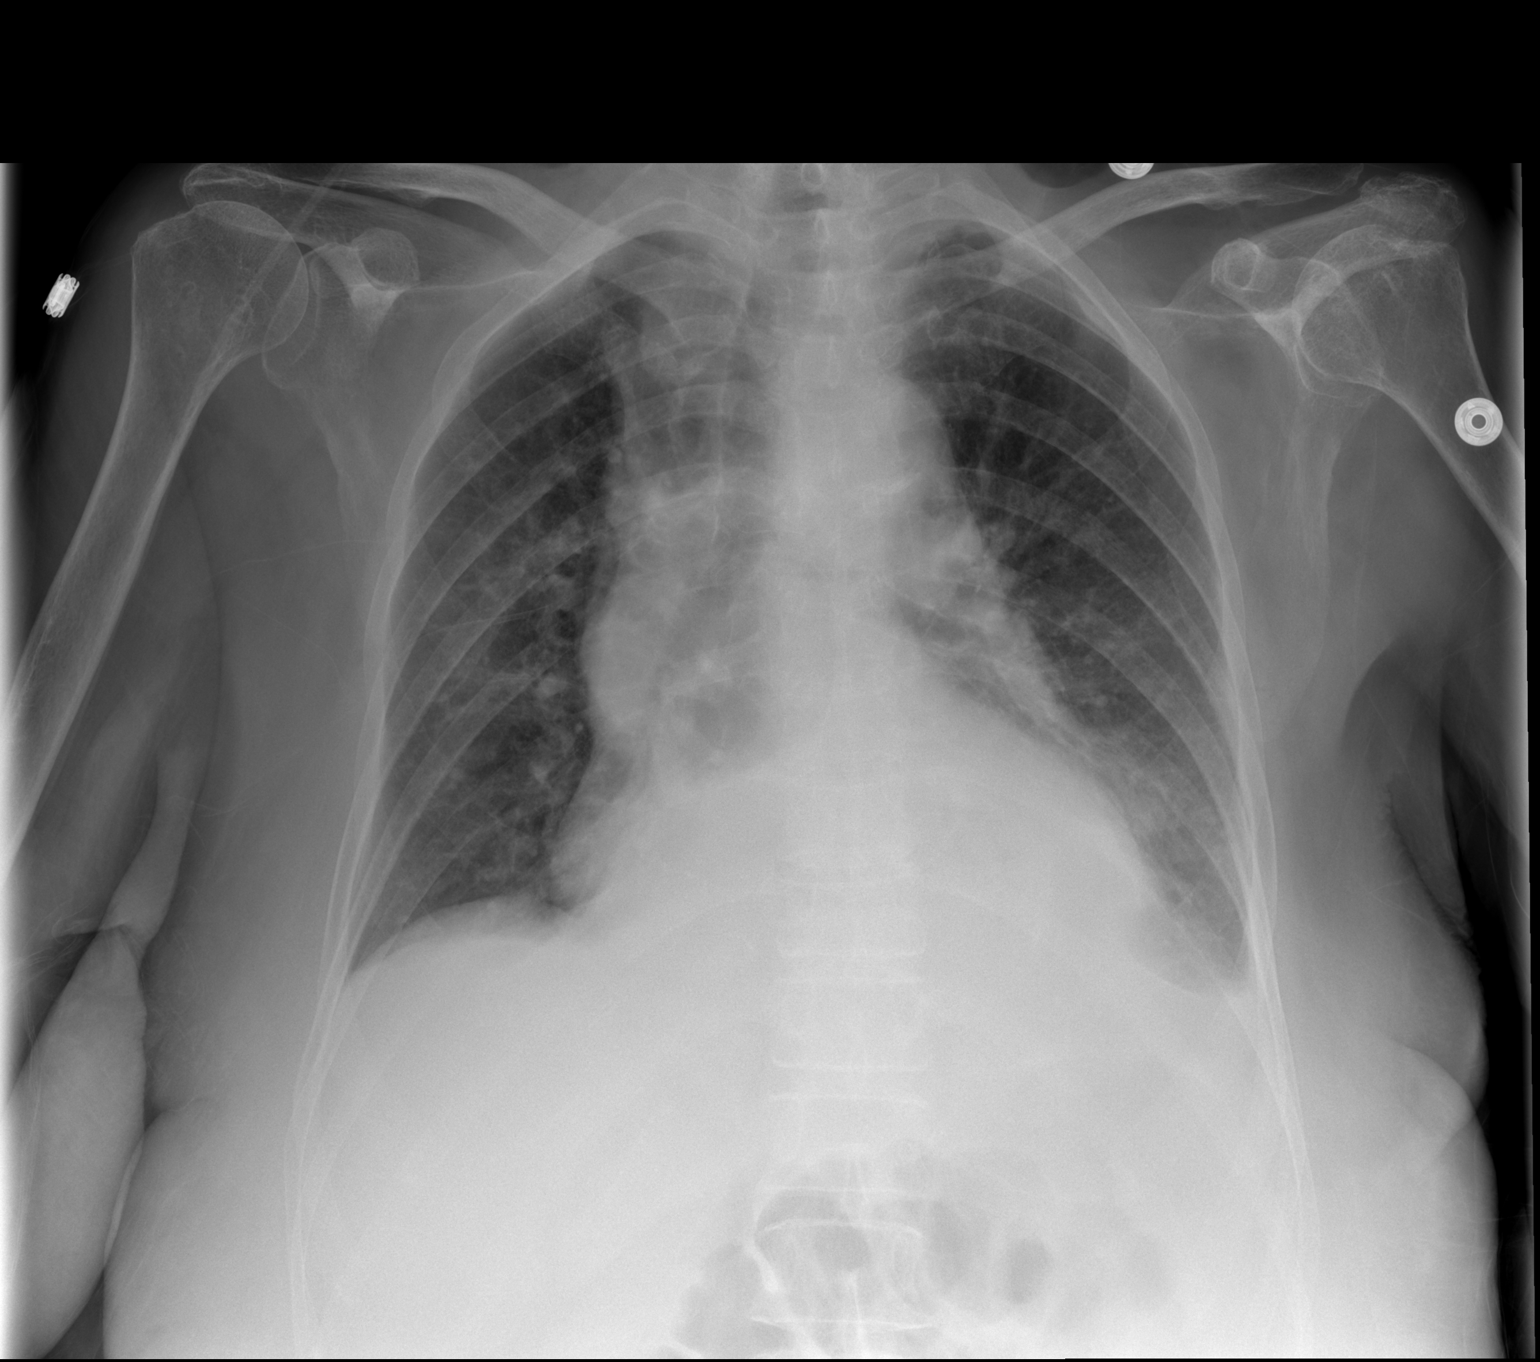
[im 2/2]
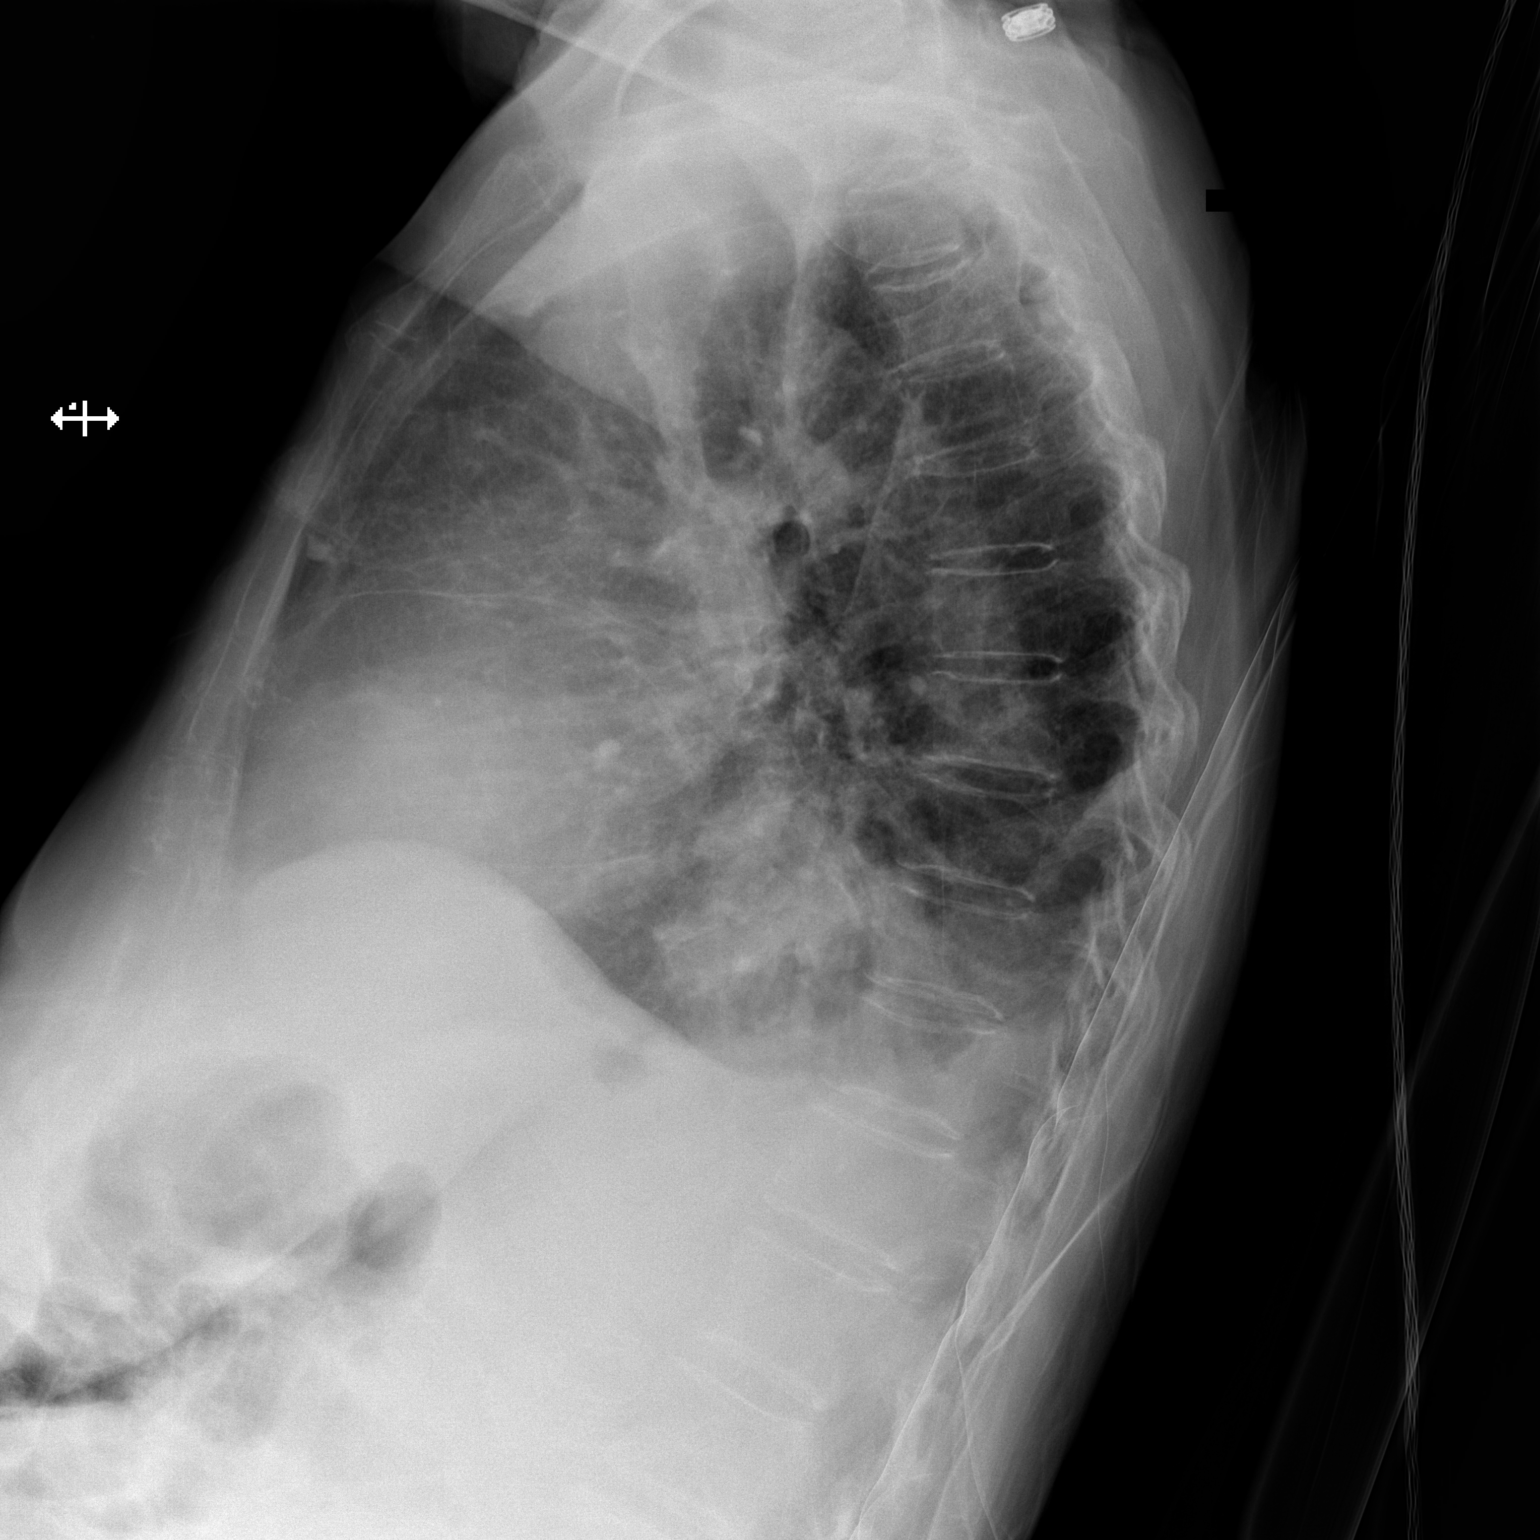

[2 of 2 positions shown; findings below may reference images not displayed]

FINDINGS: Enlargement of the cardiopericardial silhouette. Tortuous thoracic
aorta with aortic arch atherosclerosis. The patient is rotated to
the right, accounting for differences in the appearance of the
mediastinum compared to the prior study. There is a small left
pleural effusion. Retrocardiac density is present, likely
representing a combination of effusion, atelectasis and hiatal
hernia. Lung volumes are lower than on prior study. Retrocardiac
airspace disease/ pneumonia is difficult to exclude.
IMPRESSION: 1. Small left pleural effusion with retrocardiac density, likely
representing effusion, atelectasis and hiatal hernia. Superimposed
airspace disease/pneumonia it is impossible to exclude.
2. Lower lung volumes with enlargement of the cardiopericardial
silhouette. Prominence of pulmonary vasculature likely represents
pulmonary vascular congestion superimposed on low volumes.

## 2014-01-31 IMAGING — CT CT ABD-PELV W/O CM
2 of 4 series · 15 of 46 positions shown, 17 images · non-contrast
Comparison: 02/20/2013

CLINICAL DATA: Recent partial colectomy of the ascending colon. A
weakness, anasarca up. Evaluate for anastomotic leak. History of
chole rectal cancer.

EXAM:
CT ABDOMEN AND PELVIS WITHOUT CONTRAST
TECHNIQUE: Multidetector CT imaging of the abdomen and pelvis was performed
following the standard protocol without intravenous contrast.

[Series 2: routine abd pel wo · axial · 0.64mm/px · z∈[-940,-565]mm · 12 of 86 slices shown, 14 images]
[im 7/86  soft-tissue]
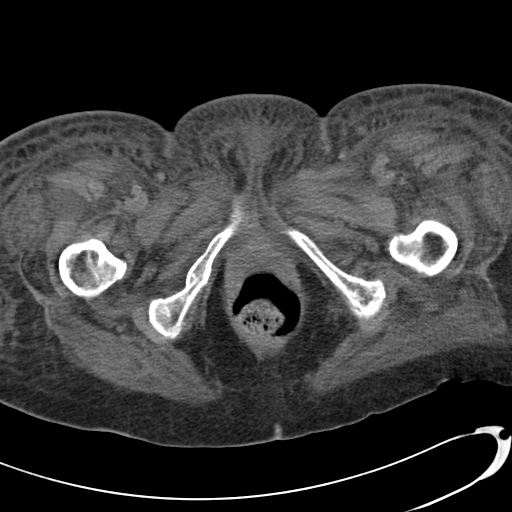
[im 7/86  bone]
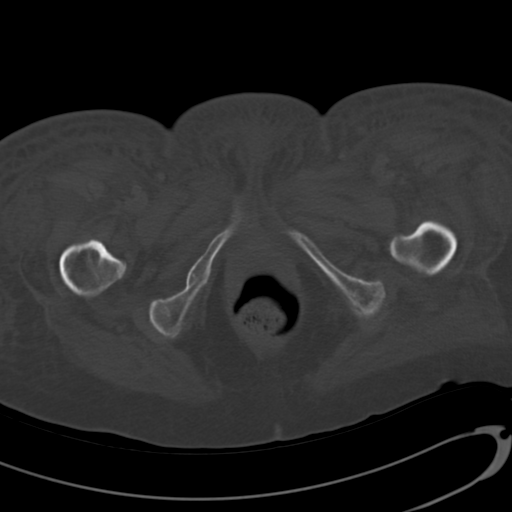
[im 14/86  soft-tissue]
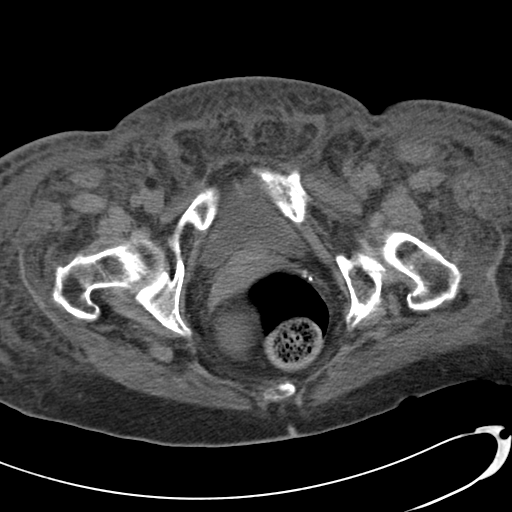
[im 21/86  soft-tissue]
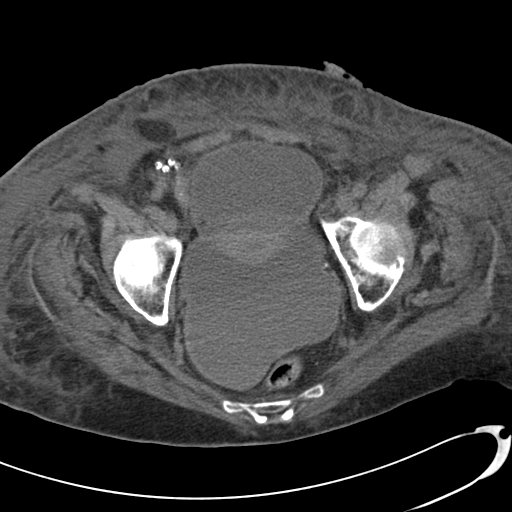
[im 28/86  soft-tissue]
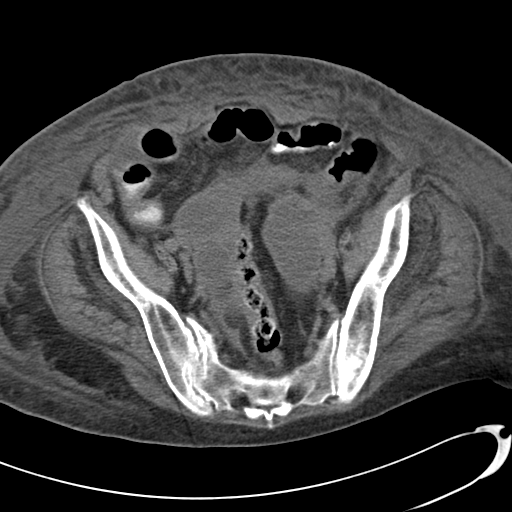
[im 35/86  soft-tissue]
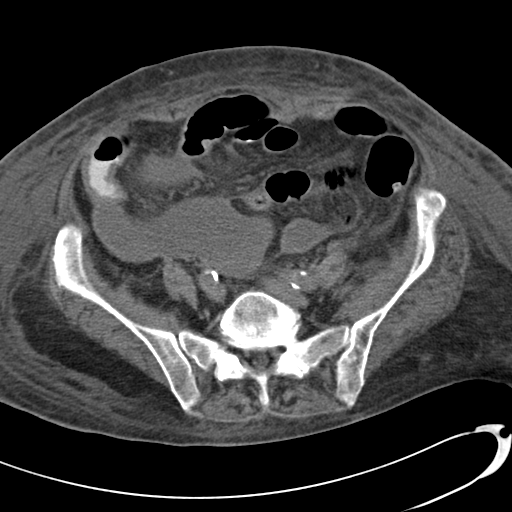
[im 41/86  soft-tissue]
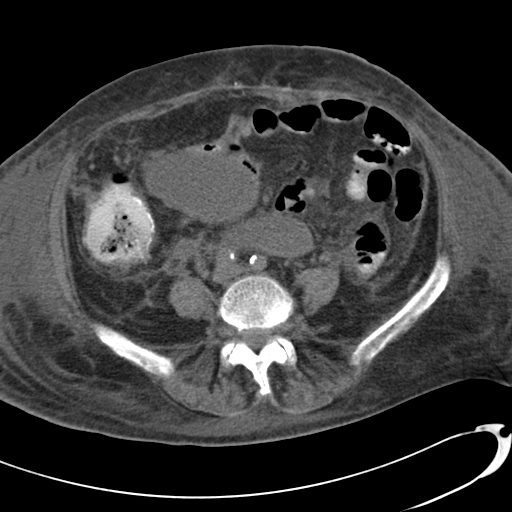
[im 48/86  soft-tissue]
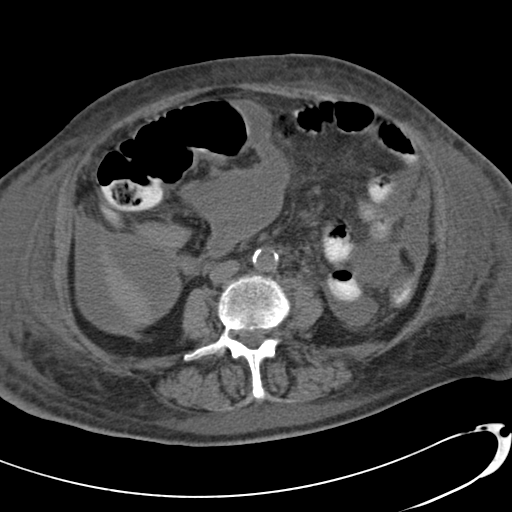
[im 55/86  soft-tissue]
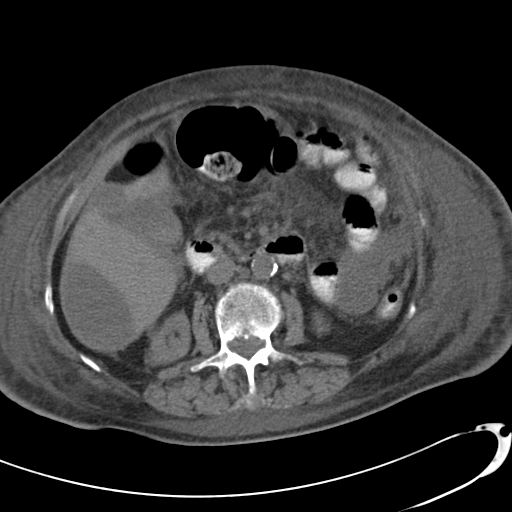
[im 62/86  soft-tissue]
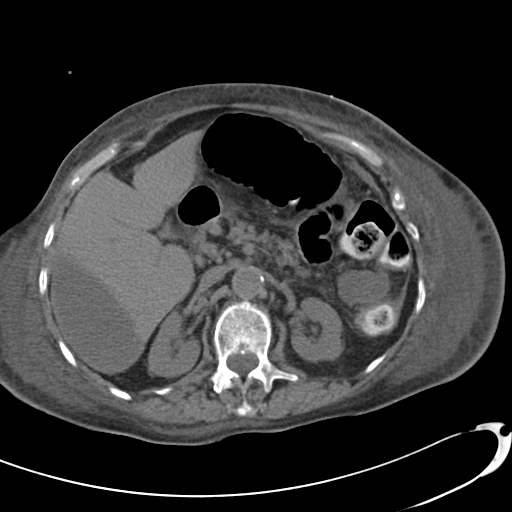
[im 62/86  bone]
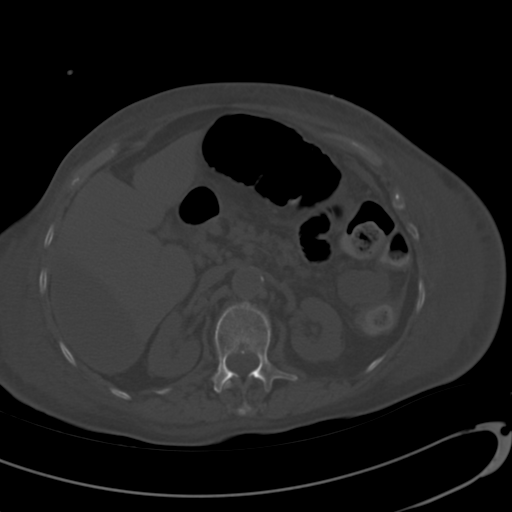
[im 69/86  soft-tissue]
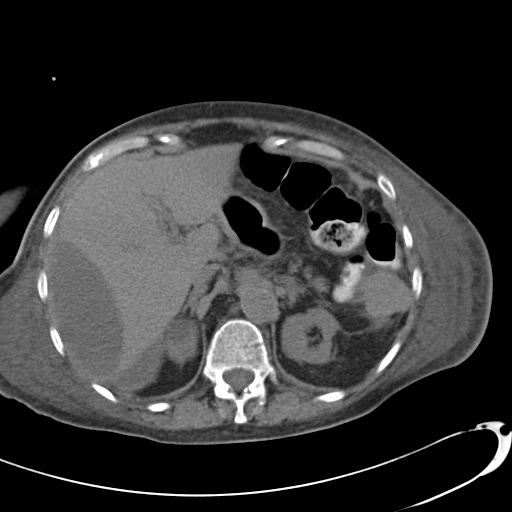
[im 75/86  soft-tissue]
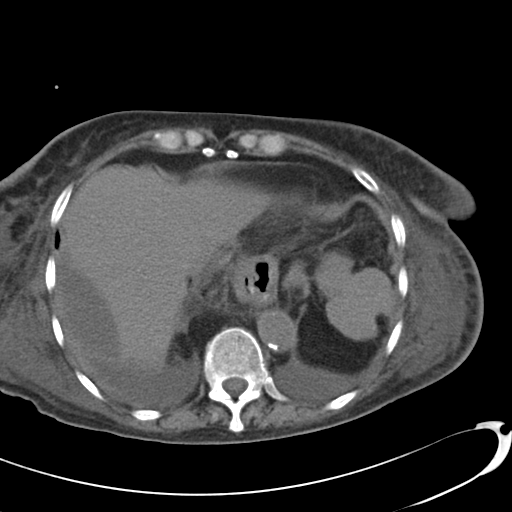
[im 82/86  soft-tissue]
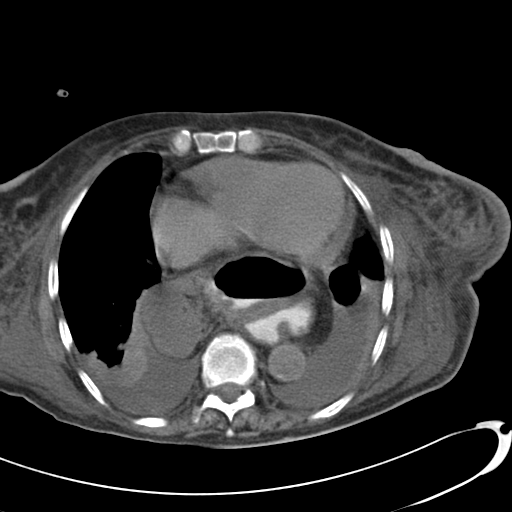

[Series 5: cor routine abd pel wo · coronal · 0.65mm/px · 3 of 127 slices shown]
[im 43/127  soft-tissue]
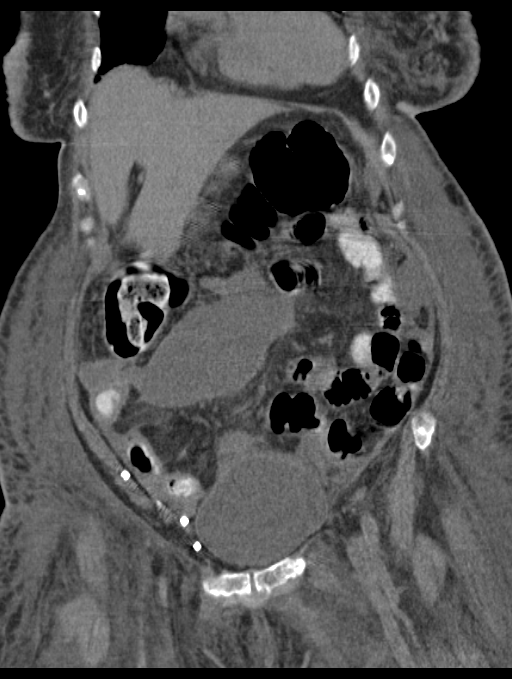
[im 57/127  soft-tissue]
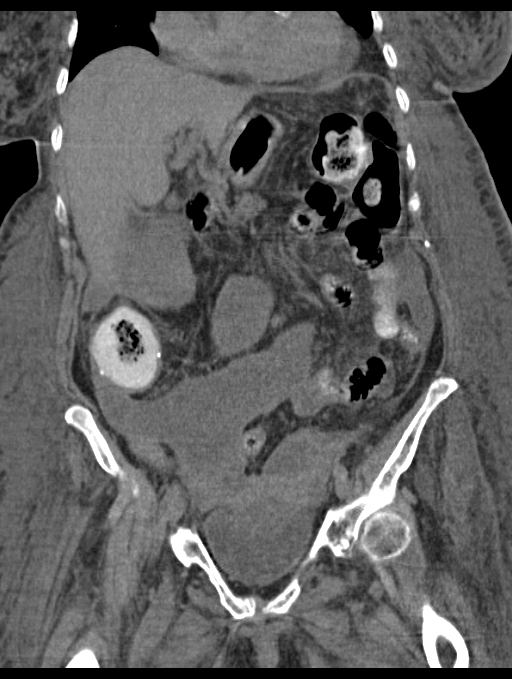
[im 71/127  soft-tissue]
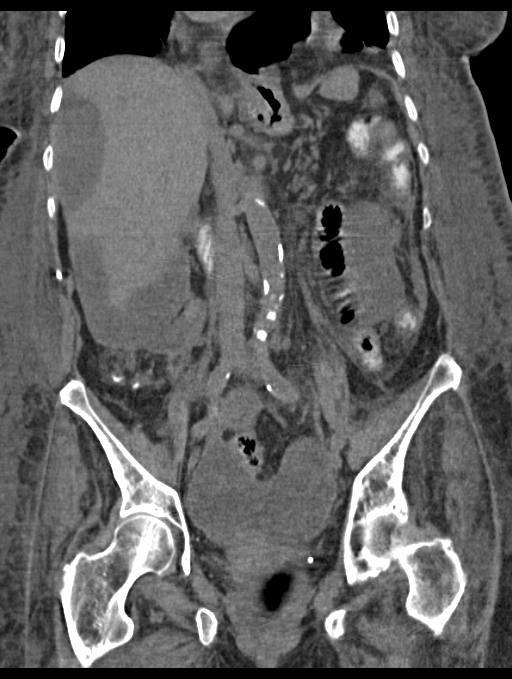

[15 of 46 positions shown; findings below may reference images not displayed]

FINDINGS: There is a large hiatal hernia again noted, stable. Moderate sized
bilateral pleural effusions are partially imaged. Compressive
atelectasis in the lower lobes. Small pericardial effusion. These
findings are new since prior study.

Changes of partial right colectomy. No contrast extravasation seen
to suggest anastomotic leak. There are numerous fluid collections
throughout the abdomen and pelvis. Large fluid collection in the
cul-de-sac measures 9.9 x 8.4 cm on image 64 and extends superiorly
into the lower abdomen. Anterior right abdominal fluid collection on
image 43 measures 7.1 x 5.2 cm. Fluid collection adjacent to the
right hepatic lobe is noted and appears to represent a subcapsular
fluid collection. The largest component of this measures 9.8 x
cm on image 17. Fluid also noted extending through the diaphragmatic
hiatus adjacent to the large hiatal hernia. Fluid collections also
noted adjacent to the left abdominal small bowel loops and the
descending colon.

Compression/ mass effect on the right hepatic lobe from the
subcapsular fluid collection. Unusual configuration of the spleen is
stable since prior study. Mild fatty replacement of the pancreas
without focal abnormality. Adrenal glands are unremarkable. Small
cysts in the kidneys without hydronephrosis.

Aorta and iliac vessels are calcified, non aneurysmal. Urinary
bladder is grossly unremarkable.

Stranding throughout the subcutaneous soft tissues compatible with
anasarca.
IMPRESSION: Prior right hemicolectomy. No extravasation of contrast at the
anastomosis to suggest anastomotic leak. There are however numerous
fluid collections throughout the abdomen and pelvis as described
above, including a subcapsular fluid collection adjacent to the
right hepatic lobe.

Moderate bilateral pleural effusions with compressive atelectasis.
Small pericardial effusion.

Large hiatal hernia which also contains fluid/ascites.

## 2014-01-31 IMAGING — CR DG CHEST 1V PORT
1 series · 1 of 1 positions shown · non-contrast
Comparison: Portable exam 7787 hr compared to 03/13/2013

CLINICAL DATA: PICC line insertion

EXAM:
PORTABLE CHEST - 1 VIEW

[ap]
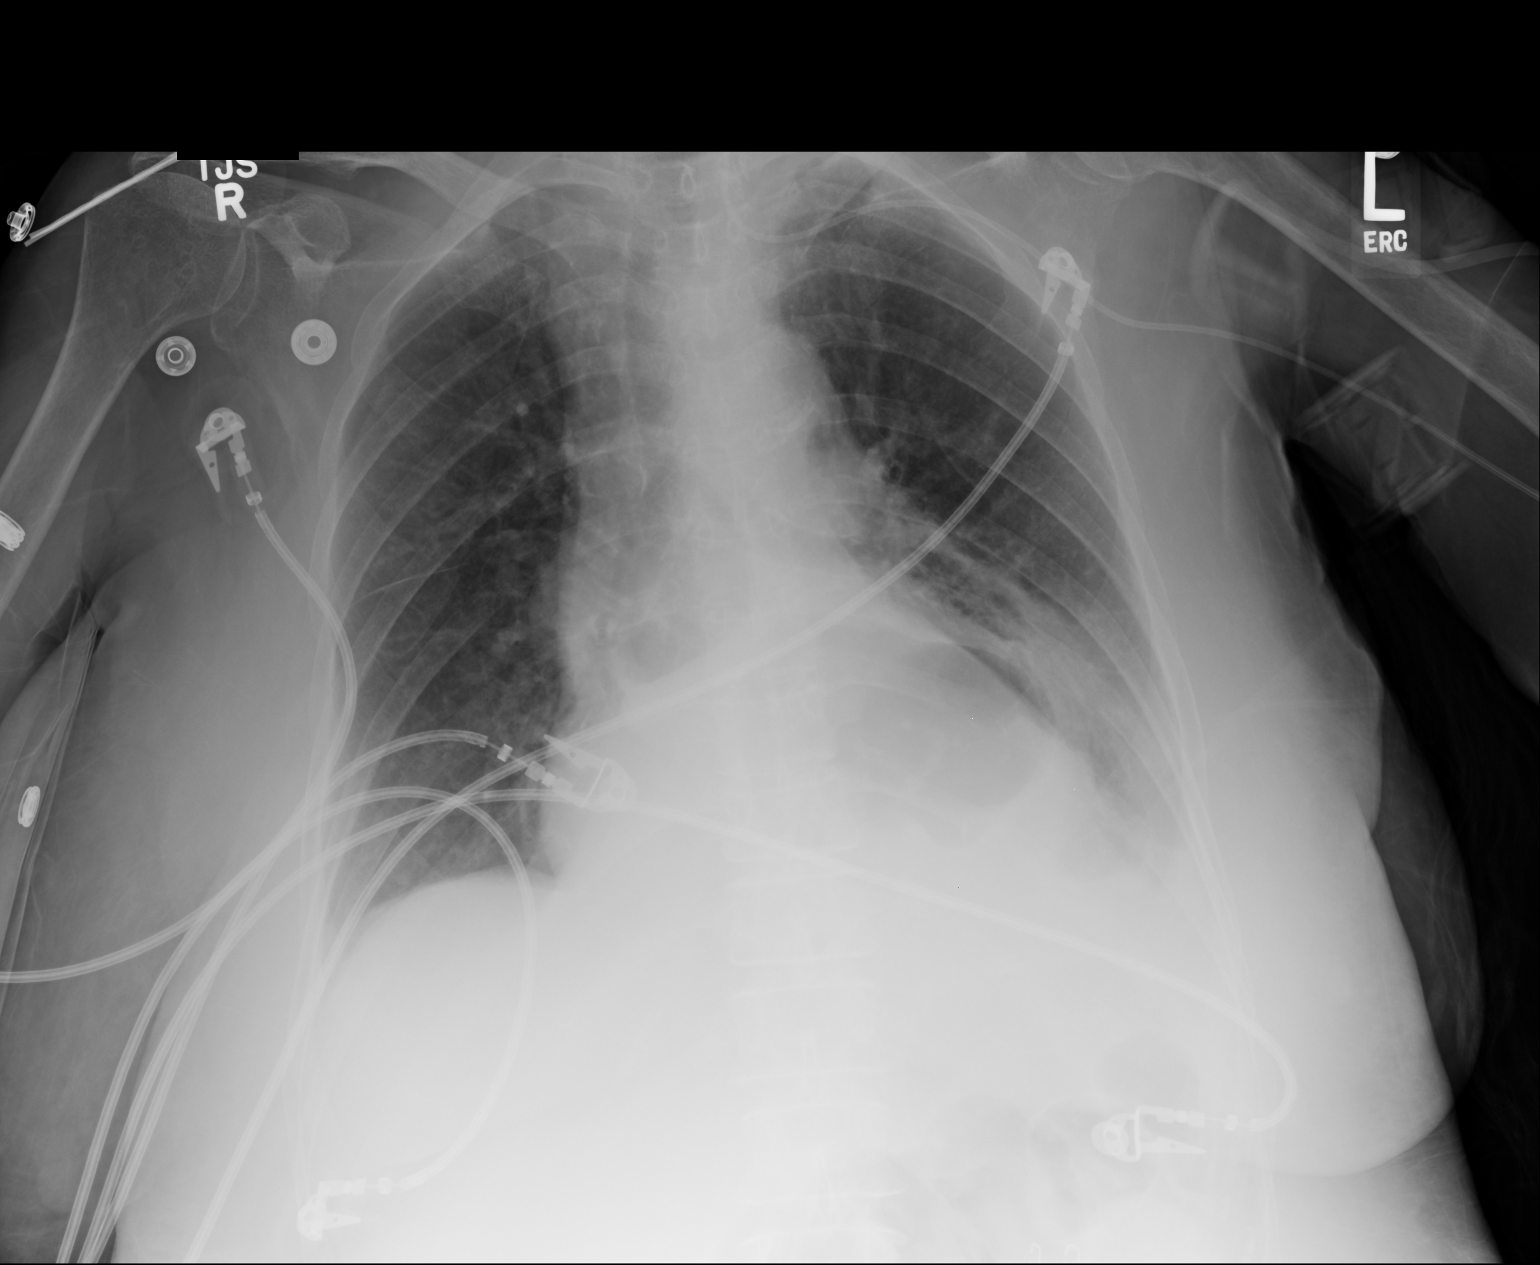

[1 of 1 positions shown; findings below may reference images not displayed]

FINDINGS: Left arm PICC line tip enters the left internal jugular vein and
extends cranial to this exam.

Minimal enlargement of cardiac silhouette.

Atherosclerotic calcification aorta.

Large hiatal hernia.

Left basilar atelectasis versus consolidation.

Mild chronic bronchitic changes.

Remaining lungs clear.

No pleural effusion or pneumothorax.

Bones demineralized.
IMPRESSION: Left arm PICC line tip enters the left internal jugular vein and
extends cranial to the chest radiograph; recommend repositioning.

Large hiatal hernia with atelectasis versus consolidation in left
lower lobe.

Line has already been repositioned and followup radiographs have
been obtained by time of interpretation of this study.

## 2014-01-31 IMAGING — CR DG ABDOMEN 3V
1 series · 5 of 5 positions shown · non-contrast
Comparison: Chest radiograph 03/13/2013, abdominal radiographs
07/09/2011

CLINICAL DATA: Pain, swelling, chest and epigastric pain, weakness,
recent [REDACTED]ization

EXAM:
ACUTE ABDOMEN SERIES (2 VIEW ABDOMEN AND 1 VIEW CHEST)

[Series 1: x abdomen supine · 0.14mm/px · 5 of 5 slices shown]
[im 1/5]
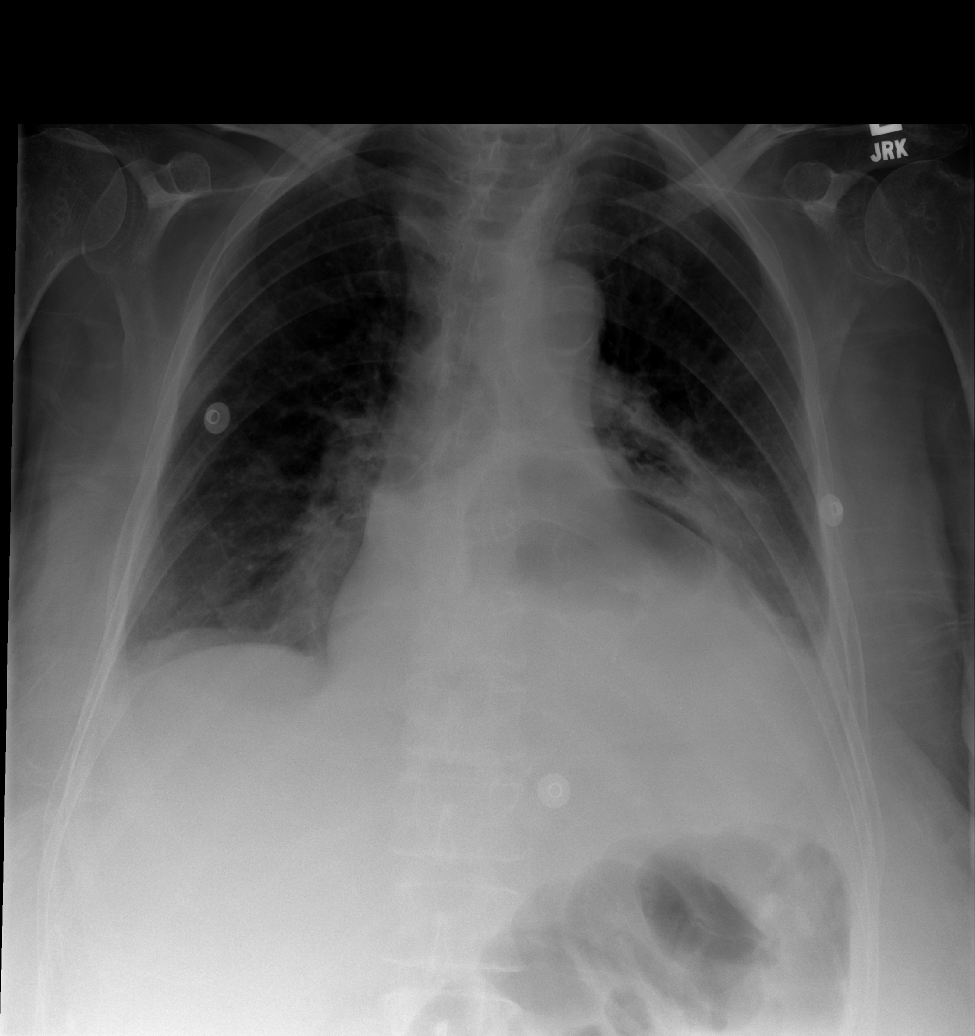
[im 2/5]
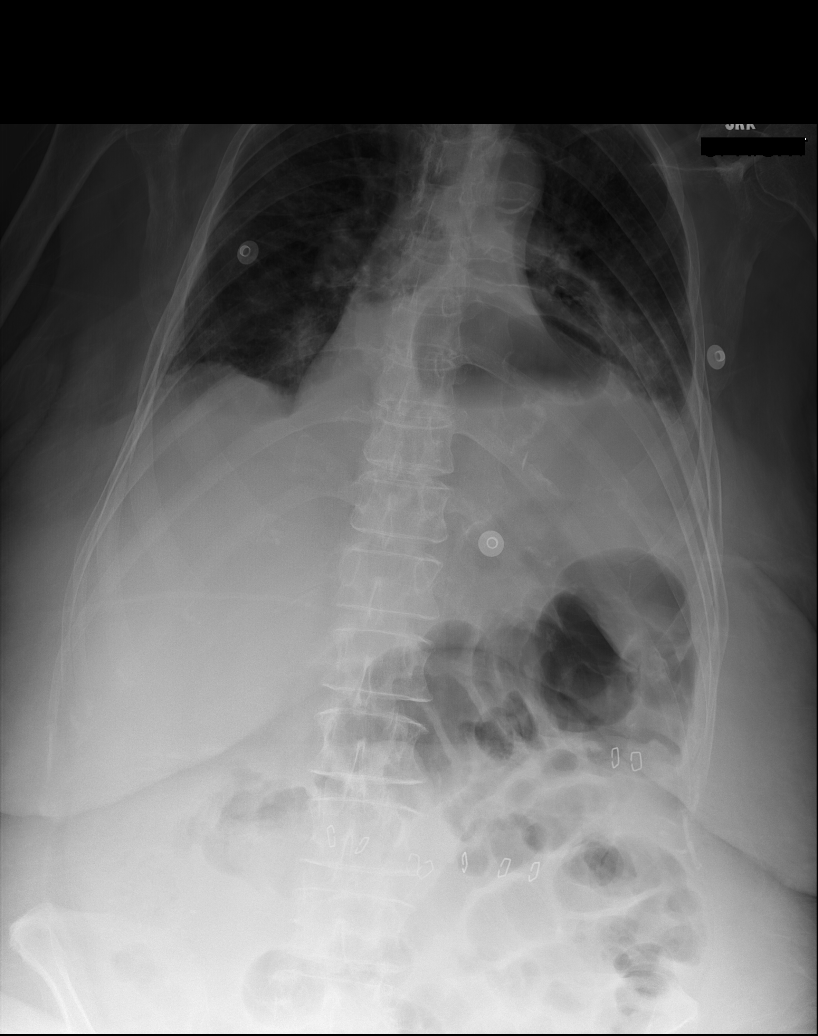
[im 3/5]
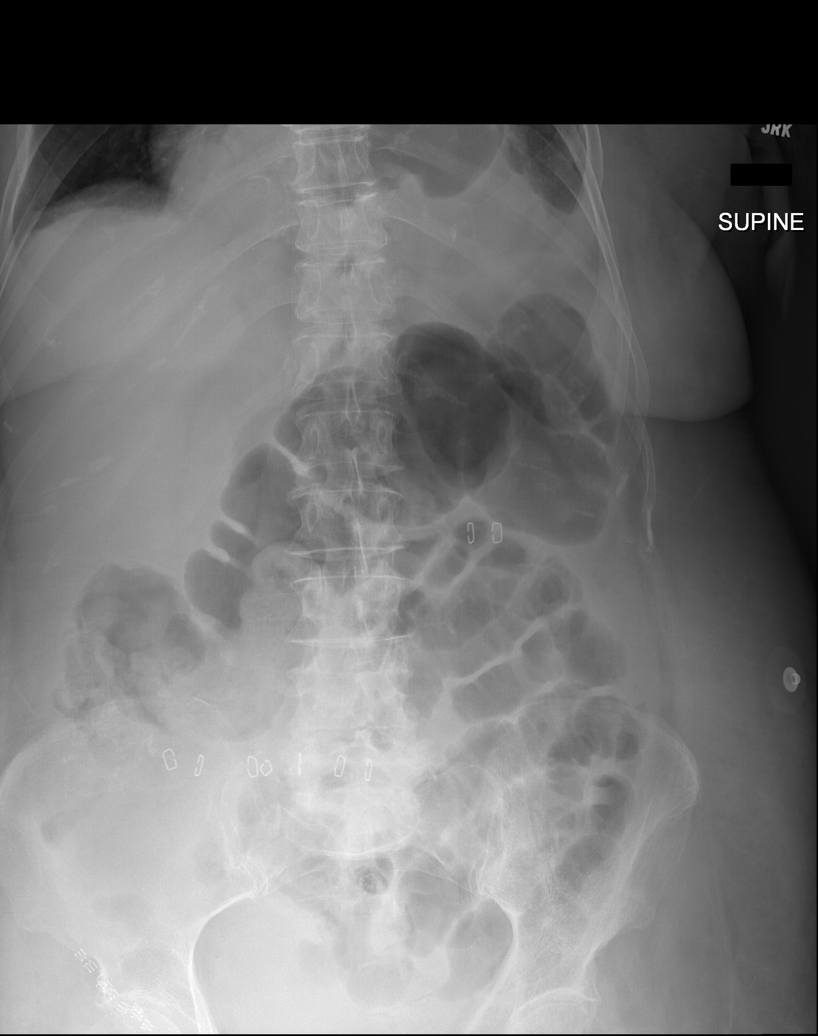
[im 4/5]
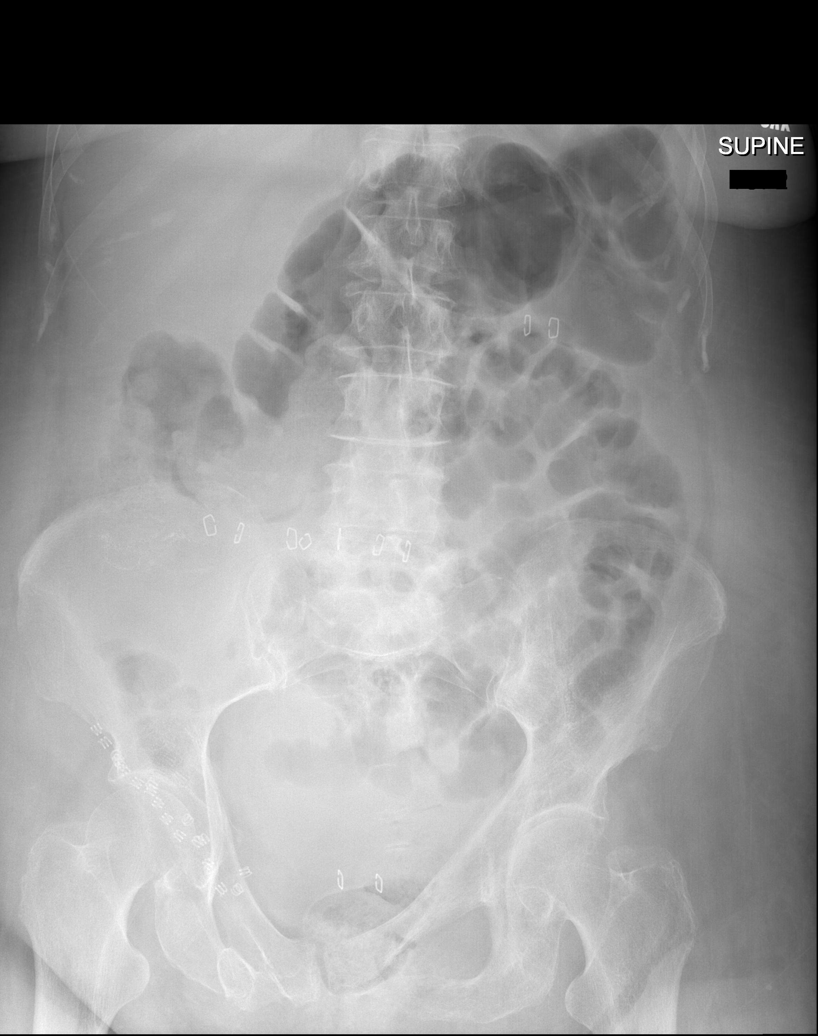
[im 5/5]
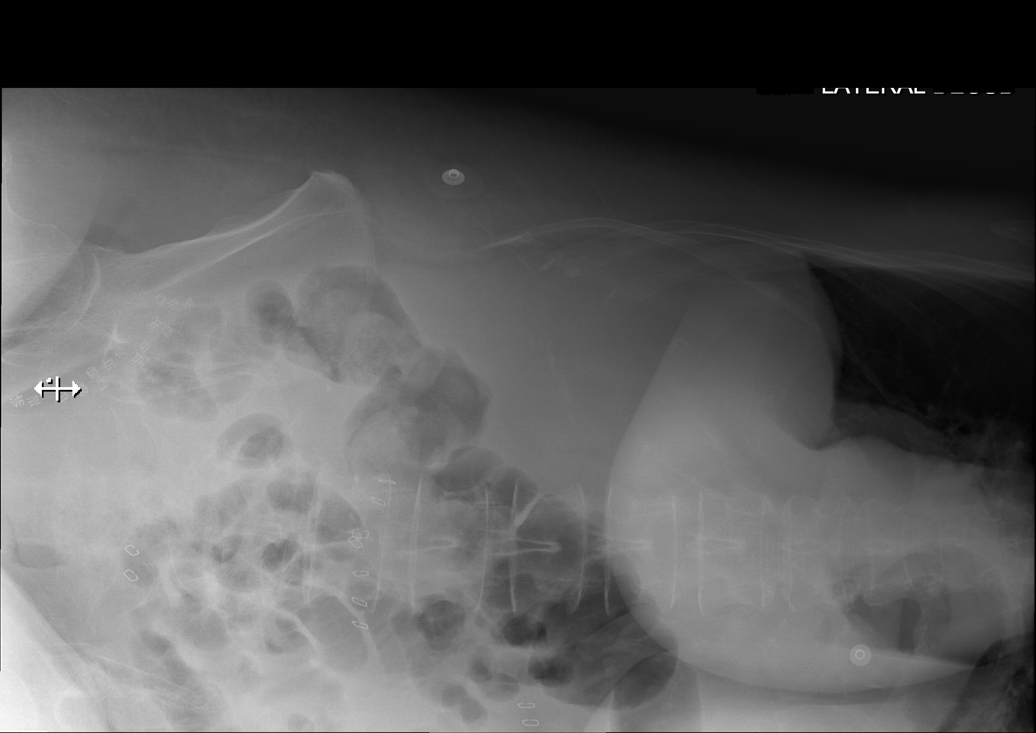

[5 of 5 positions shown; findings below may reference images not displayed]

FINDINGS: Enlargement of cardiac silhouette.

Atherosclerotic calcification aorta.

Stable mediastinal contours with large hiatal hernia again
identified.

Persistent atelectasis and pleural effusion at left lung base.

Remaining lungs clear.

No pneumothorax.

Bones demineralized.

Gas identified within large and small bowel loops.

Stool in right colon.

No evidence of bowel dilatation, bowel wall thickening or free
intraperitoneal air.

Probable prior right inguinal hernia repair.

No urinary tract calcification.
IMPRESSION: Enlargement of cardiac silhouette.

Large hiatal hernia.

Persistent left basilar atelectasis and small pleural effusion.

No acute abdominal findings.

Little change.

## 2014-01-31 IMAGING — CR DG CHEST 1V PORT
1 series · 1 of 1 positions shown · non-contrast
Comparison: Portable exam 5544 hr compared to 9963 hr

CLINICAL DATA: PICC line repositioning

EXAM:
PORTABLE CHEST - 1 VIEW

[ap]
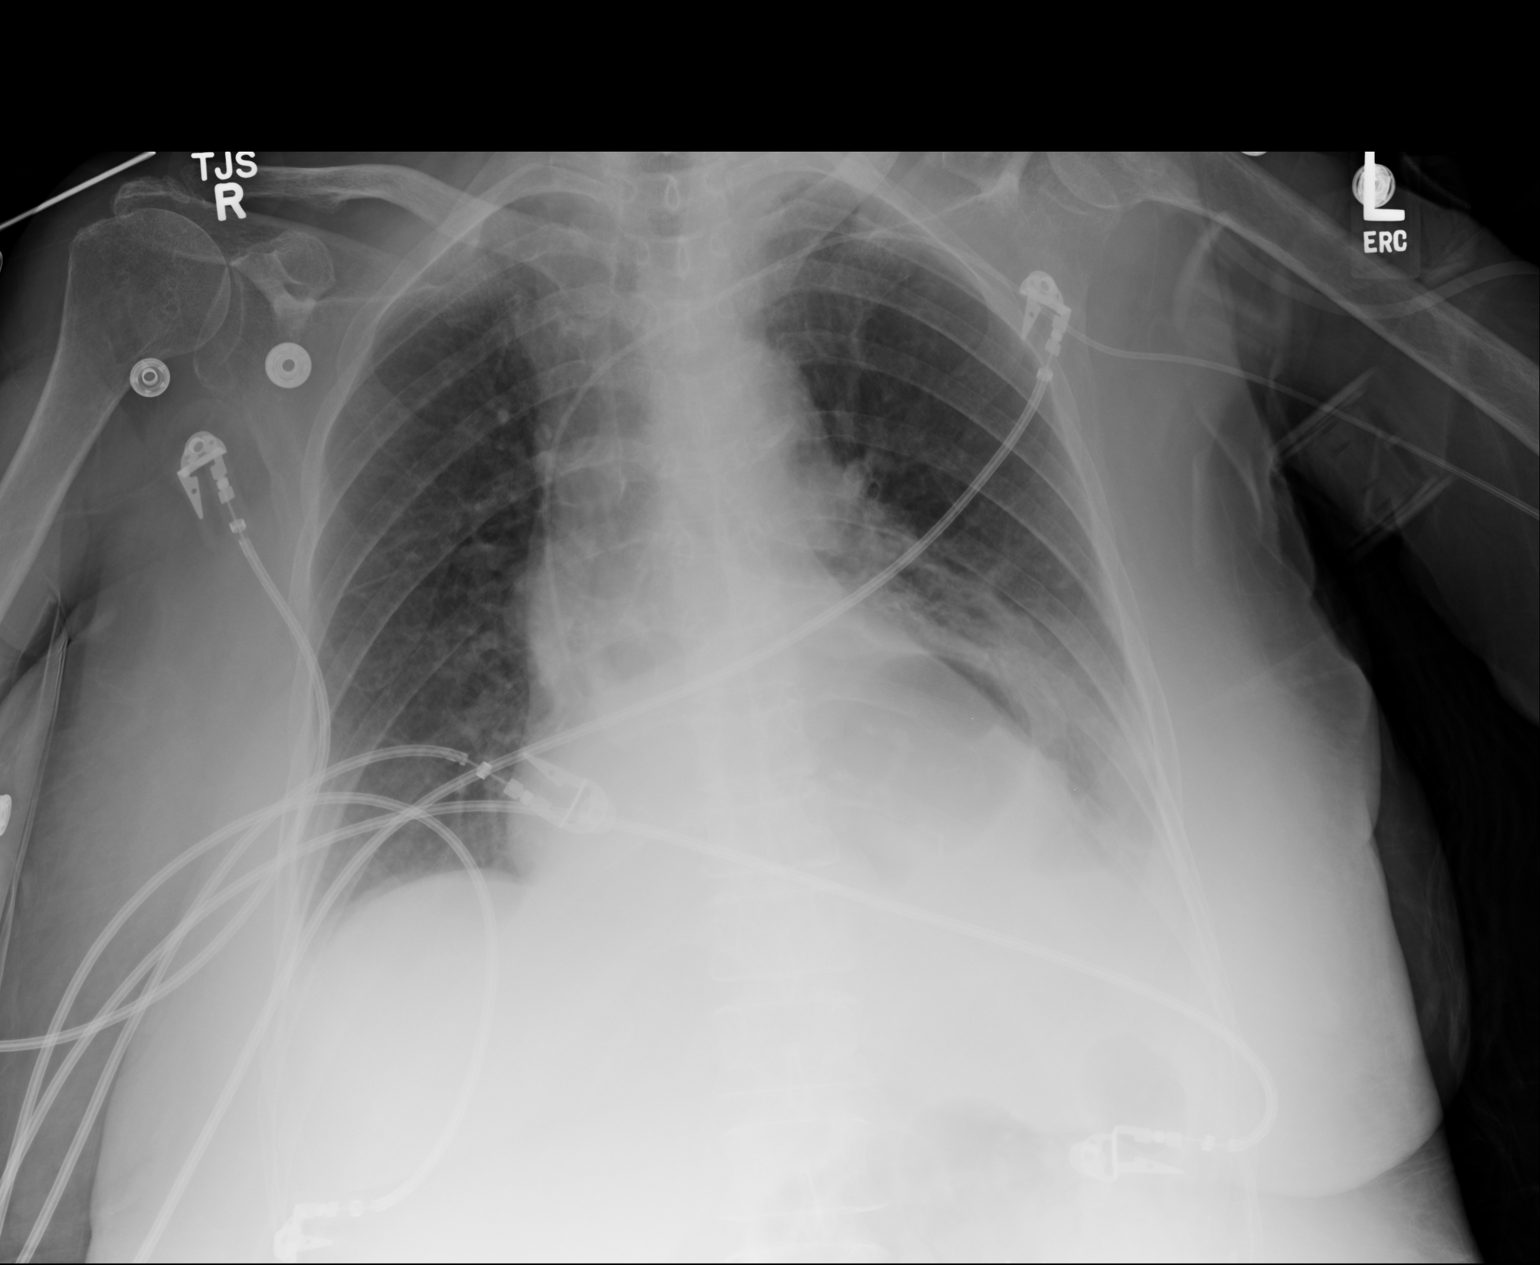

[1 of 1 positions shown; findings below may reference images not displayed]

FINDINGS: Left arm PICC line tip now projects over the SVC.

Heart remains enlarged.

Large hiatal hernia.

Persistent atelectasis versus consolidation in left lower lobe.

Remaining lungs clear.
IMPRESSION: Tip of left arm PICC line now projects over the SVC.

Remainder of exam unchanged.

## 2014-07-26 NOTE — Consult Note (Signed)
she was examined.  Lab data has been revieweda note to follow will start patient on Hydrea 500 mg daily from tomorrowmonitor platelet countaspirin and Lovenoxconsider dose of intravenous iron as falling hemoglobin may be secondary to GI bleeding or operative blood lossconsider dose of Procrit  Electronic Signatures: Davin Archuletta, Martie Lee (MD)  (Signed on 02-Dec-14 08:53)  Authored  Last Updated: 02-Dec-14 08:53 by Jobe Gibbon (MD)

## 2014-07-26 NOTE — Consult Note (Signed)
Patient is somewhat feeling better.  Abdominal pain has improved.  Nausea is better.  Patient was in the chair twice yesterday.  Being followed for myelofibrosis, thrombocytosis, anemia, renal failure .examinationsigns have been reviewed pale lookingclearTachycardia soft systolic murmurSoft.  Bowel sounds are present.extremity trace edemadata shows platelet count more than 500,000creatinine is risingconsult appreciated  platelet count keeps a rising an extra dose of Hydrea would be given.received Procrit 40,000 unitsof giving intravenous ironof colonis pending   Electronic Signatures: Bernardino Dowell, Martie Lee (MD)  (Signed on 04-Dec-14 17:54)  Authored  Last Updated: 04-Dec-14 17:54 by Jobe Gibbon (MD)

## 2014-07-26 NOTE — Consult Note (Signed)
PATIENT NAME:  Tanya Rice, Tanya Rice [id number] OF BIRTH:  May 03, 1926 OF CONSULTATION:  03/19/2013   REASON FOR CONSULTATION:  Pericardial effusion  OF PRESENT ILLNESS: is an 81 yo who was recently discharged from [facility] after R colectomy (for adenoCA of cecum).  Found to be weak, cold and confused.  In ED found to have elevated Crk increased WBC.  Abdominal CT (without contrast) showed  MEDICAL HISTORY: Adenocarcinoma of cecumStage IIIportal vein thrombosismyeloproliferative disorder (increased WBC, increased plt, anemia)UTIelevated troponin (in setting of CKD)TIAdiff colitis  Right colectomy; cataract surgery, bilateral ingunial hernia repair, L rotator cuff surgery.  From the facility: Albuterol/ipratropium 2.5 mg/0.5 mL 3 mL inhalation solution as needed, aspirin 81 mg p.o. daily, hydroxyurea 500 mg p.o. daily, levothyroxine 50 mcg p.o. daily, metoprolol tartrate 25 mg p.o. twice daily, pantoprazole 40 mg p.o. twice daily, tramadol 50 mg p.o. every 6 hours as needed, Tylenol 325 mg 2 tablets every 4 hours as needed.   ALLERGIES: AUGMENTIN, MORPHINE, PENICILLIN, SULFA DRUGS, TRIAMTERENE, HYDROCHLOROTHIAZIDE, UAA, VICODIN, APPLE, PEACH, AS WELL AS SOY.  HISTORY: She lived in [place] alone; however, recently she was discharged to a rehabilitation facility. No tobacco, alcohol, or drug abuse.  HISTORY: The patient's mother had brain tumor, died in her 27s. The patient's father lived to be 75 and had a questionable heart attack, died in his sleep.  OF SYSTEMS:  All systems reviewed  Negative to the above problem except as noted above   EXAMINATION: SIGNS: T 97.8  BP 120/70  P 80  Sat 95% (2L) Thin 81 yo in [place] pupils are equal, reactive to light. Extraocular movements are intact. No icterus or conjunctivitis.  No masses. Supple, nontender. Thyroid is not enlarged.JVP at 8 Clear to auscultation anteriorly. A few crackles were heard at the bases posteriorly, as well as diminished breath sounds bilaterally  posteriorly. CARDIOVASCULAR: Normal S1, S2. No S3 or S4  Gr I to II/VI systolic murmur at apex.  No rub.  Pedal pulses 2+. Significant lower extremity edema bilaterally was noted, approximately 3+.Has dressing in the mid below umbilical area; however, no significant tenderness on palpation was noted. . It was soft to palpation. Bowel sounds are present, though but diminished. No hepatosplenomegaly or masses were noted. Deferred. Moving all extremities  SKIN: Otherwise did not show any ulcerations or lesions or other abnormalities.Patient alert, oriented x 2.  Tired   AND DIAGNOSTIC:   Labs significant for:  Hgb 9.7  Plt 435  WBC 48.2 BUN61 Cr 3.1 NA 129  Mg 1.6  Lipase 57  Total bili 3.3  Direct 2.5  Total protein 2.4  TSH 4.1.   NSR 91 bpm   STUDIES: Abdomen 3-way including PA of chest revealed enlargement of cardiac silhouette, large hiatal hernia, persistent left basilar atelectasis, and small pleural effusion. No acute abdominal findings were noted, and little change was found since prior study. CT scan of abdomen and pelvis (without contrast):   prior right hemicolectomy, no extravasation of contrast at the anastomosis to suggest anastomotic leak. There are, however, numerous fluid collections throughout the abdomen and pelvis as described above, including a subcapsular fluid collection adjacent to the right hepatic lobe. Moderate bilateral pleural effusions with compressive atelectasis were also noted, as well as small pericardial effusion. Large hiatal hernia, which also contains fluid ascites according to radiologist.  AND PLAN: Pericardial effusion  Small on CT  Most likey related to anasarca, hypoalbuminemia.  Does not appear to be hemodynamically destablizing.  Agree with diuress.  Agree with  echo CKD  Will need to follow as diurese  May need to contact renal service if deteriorates    ANasarca.  Related to poor nutrition, albumin 1.4.   Onc.  S/o R colectomy.  Hx of myelodysplasia ID  Agree with ABX  F/E/N  Note plans for bicarb infusion.    Pateint 38 with multiple metabolic problems.  Overall prognosis is guarded     Electronic Signatures: Dorris Carnes (MD) (Signed on 15-Dec-14 21:02)  Authored   Last Updated: 15-Dec-14 21:40 by Dorris Carnes (MD)

## 2014-07-26 NOTE — Consult Note (Signed)
overnight patient's condition has declined.  Was transferred to ICU for close monitoring. Has progressive renal failure , on bicarbonatedripmorning patient is somewhat lethargic.not voice to many complainsexaminationsigns have been reviewedlooking sclera are no jaundice HEENT:   Normocephalic, atraumatic, eyes anicteric. Mucous membranes moist. No ulcers, lesions, or thrush.  Abdomen: Tenderness present.  Mild distention.  Bowel sounds are present.  Lower extremity trace edema system: Difficult to assess data has been reviewedcount is 34,000is 8.6count is 315, 000 supportive therapyno be evaluatedHydrea 1 tablet dailyappropriate on   this thursday  Electronic Signatures: Jobe Gibbon (MD)  (Signed on 16-Dec-14 12:49)  Authored  Last Updated: 16-Dec-14 12:49 by Jobe Gibbon (MD)

## 2014-07-26 NOTE — Op Note (Signed)
PATIENT NAME:  TAETUM, FLEWELLEN MR#:  [id number] DATE OF BIRTH:  02-Jul-1926  DATE OF PROCEDURE:  03/05/2013  PREOPERATIVE DIAGNOSIS: Right colon carcinoma.   POSTOPERATIVE DIAGNOSIS: Right colon carcinoma.  OPERATION: Laparoscopically-assisted right colectomy using the [employer] robot.   SURGEON: Rodena Goldmann, MD.   ANESTHESIA: General.   OPERATIVE PROCEDURE: With the patient in the supine position after induction of appropriate general anesthesia, the patient's abdomen was prepped with ChloraPrep and draped with sterile towels. Umbilical transverse incision was made and carried down through the subcutaneous tissue with Bovie electrocautery. The Veress needle was used to cannulate the peritoneal cavity. CO2 was insufflated with appropriate pressure measurements. When approximately 2.5 L of CO2 were instilled, the Veress needle was withdrawn and an 8.5 mm robotic port was placed into the abdominal cavity. Appropriate position was confirmed. CO2 was reinsufflated. Suprapubic port and left upper quadrant port were both placed under direct vision. The robot was brought to the table and docked to the patient and the cannula without difficulty. Instruments were inserted under direct vision into the right lower quadrant. I then moved to the console.  The right colon was mobilized from the terminal ileum to the mid transverse colon using Bovie electrocautery and blunt dissection. Dissection was uncomplicated. There did appear to be some  significant adhesions to the right colon, likely felt to be related to the malignancy. Once satisfactory exposure was obtained, the robot was undocked and the midline incision enlarged and carried down through both abdominal and fascial layers into the peritoneum. The bowel was mobilized into the wound. The bowel mobilization was quite difficult because of the size of the tumor. The tumor was approximately a baseball size. There appeared to be serosal involvement. There was  no obvious lymphadenopathy, but significant edema in the mesentery. An area was chosen on the small bowel for the resection on the proximal transverse colon and those areas were divided with the GIA 75 stapling device carrying a blue load. The mesentery was taken down with the LigaSure and the ileocolic system was oversewn with 0 Vicryl. The bowel was then passed off the table. The loops of bowel were placed side by side. Small enterotomy was made in each loop of bowel. Another application of the Endo GIA stapler was utilized for the anastomosis in a functional end-to-end fashion starting at one limb and placing the stapler in each loop of bowel. The staples were approximated and fired. There was no significant bleeding problem. The enterotomy was closed with a single application of the OH-60 device carrying a blue load. The staple lines were oversewn with 3-0 silk. The mesenteric defect was closed with 3-0 Vicryl. Bowel contents were returned to their anatomic position. The area was copiously irrigated with warm saline solution. Posterior fascia and peritoneum were closed with running suture of 0 Vicryl and the anterior fascia was closed with interrupted figure-of-eight sutures of 0 Maxon. The umbilicus was reapproximated using 3-0 Vicryl. The area was infiltrated with 0.25% Marcaine for postoperative pain control. Penrose drain was placed in the base of the wound and the wound was stapled. Sterile dressings were applied. The patient was returned to the recovery room having tolerated the procedure well. Sponge, instrument and needle counts were correct x 2 in the operating room.   ____________________________ Micheline Maze, MD rle:aw D: 03/05/2013 10:24:33 ET T: 03/05/2013 11:37:41 ET JOB#: [id number]  cc: Micheline Maze, MD, <Dictator> Lupita Dawn. Candace Cruise, MD Martie Lee. Oliva Bustard, MD Eduard Clos  Gilford Rile, MD Rodena Goldmann MD ELECTRONICALLY SIGNED 03/06/2013 0:35

## 2014-07-26 NOTE — Consult Note (Signed)
Brief Consult Note: Diagnosis: acidosis, elevated t. bilirubin, malnutrition, seevre calorie, protein, anasarca, hypothyroidism, leuklocytosis, acute on chronci renal failure with anasarca.   Patient was seen by consultant.   Consult note dictated.   Recommend further assessment or treatment.   Orders entered.   Comments: 1. Acidosis, likley multifactorial, poor perfusion, renal failure related, trying to get ABG's to determine best treeatment plan, family is reluctant to allow [place] to do arterial stick 2. malnutrition, severe protein, calorie, now on TPN by surgery, PICC line will be placed 3. elevated t.bilirubin, new awaiting for CT results, may benefit from [place] of RUQ evaluation, but no pain on palpation though, related to fluid retention? 4. anasarca,. third spacing due to hypoalbuminemia, normal EF on Echo 2013, will repeat it, will start lasix IV, follwoing Cr closely 5. leukocytosis, ? ethiology, no fevers, Bp is stable, unlikely sepsis or SIRS, poss related to stress, follwoing CT results, no Ab at present 6. acute on chronic renal failure, get bladder scan, may need Foley placed, get Ua, cx, following Thanks for consult, we'll follow PS UA looks dirty, pt has hematuria, pyuria, could be UTI, will start Ab IV, place Foley as well to monitor ins/outs.  Electronic Signatures for Addendum Section:  Theodoro Grist (MD) (Signed Addendum 15-Dec-14 16:52)  Updated family about CT results, planed treatment, etc, time spent additional 20 minutes   Electronic Signatures: Theodoro Grist (MD)  (Signed 15-Dec-14 16:34)  Authored: Brief Consult Note   Last Updated: 15-Dec-14 16:52 by Theodoro Grist (MD)

## 2014-07-26 NOTE — Consult Note (Signed)
PATIENT NAME:  Tanya Rice, Tanya Rice MR#:  [id number] DATE OF BIRTH:  February 01, 1927  DATE OF CONSULTATION:  03/19/2013  REFERRING PHYSICIAN:  Micheline Maze, MD CONSULTING PHYSICIAN:  Theodoro Grist, MD  REASON FOR CONSULTATION:  Consult was requested by Dr. Pat Patrick in regards to medical management of patient with complications.   HISTORY OF PRESENT ILLNESS: The patient is an 81 year old Caucasian female with past medical history significant for history of right colectomy 2 weeks ago, who presents back to the hospital just being discharged on the 12th of December 2014 after she was found to be very weak, very cold, as well as confused. She was sent to Emergency Room for further evaluation. In the Emergency Room, she was noted to have normal labs, and hospitalist services were contacted for consultation. The patient is being admitted by surgeons to medical service since her kidney function is getting worse and she was also noted to have elevated white blood cell count out of proportion from labs before that she had prior to discharge on the 12th of December 2014.   PAST MEDICAL HISTORY: Significant for history of adenocarcinoma of cecum, status post right colectomy 2 weeks ago by Dr. Pat Patrick, complicated by acute on chronic renal failure, ileus, postoperative anemia, failure to thrive adult. She was discharged to a rehabilitation facility on the 12th of December 2014. Past medical history is also significant for a history of admission in April 2013 for portal vein thrombosis with abnormal liver function tests, likely secondary to portal vein thrombosis, now anticoagulated with Coumadin therapy, being followed by Dr. Ronette Deter. Also a history of myeloproliferative disorder with leukocytosis, anemia, as well as thrombocytosis, followed by Dr. Oliva Bustard. Stage III chronic kidney disease, history of gastric ulcer, history of left foot as well as heel pain of unclear etiology, history of anemia of chronic disease. Hospital  also admission in January 2013 for epigastric pain secondary to gastric ulcer, history of urinary tract infections in the past, history of elevated troponin which was felt to be due to poor renal clearance rather than acute coronary syndrome according to medical records.   Medical history is also significant for hyperlipidemia, fibrocystic breast disease, hypertension, allergic rhinitis, history of arthritis, pelvic fracture, status post bone marrow biopsy in June 2006 which was benign, history of cataract surgery bilaterally, inguinal hernia repair bilaterally, also left shoulder rotator cuff surgery.   Medical history is also significant for questionable transient ischemic attack, history of C. difficile colitis in 2006, history of degenerative disk disease, chronic lower back pain and radiculopathy, chronic headaches, anemia of chronic disease, benign positional vertigo, hypothyroidism, allergic rhinitis, insomnia.   MEDICATIONS: From the facility: Albuterol/ipratropium 2.5 mg/0.5 mL 3 mL inhalation solution as needed, aspirin 81 mg p.o. daily, hydroxyurea 500 mg p.o. daily, levothyroxine 50 mcg p.o. daily, metoprolol tartrate 25 mg p.o. twice daily, pantoprazole 40 mg p.o. twice daily, tramadol 50 mg p.o. every 6 hours as needed, Tylenol 325 mg 2 tablets every 4 hours as needed.  PAST SURGICAL HISTORY: Left shoulder rotator cuff surgery, cataract surgery, as well as bilateral inguinal hernia repairs, also recent surgery of right colectomy due to adenocarcinoma of cecum.   ALLERGIES: AUGMENTIN, MORPHINE, PENICILLIN, SULFA DRUGS, TRIAMTERENE, HYDROCHLOROTHIAZIDE, UAA, VICODIN, APPLE, PEACH, AS WELL AS SOY.   SOCIAL HISTORY: She lived in [place] alone; however, recently she was discharged to a rehabilitation facility. No tobacco, alcohol, or drug abuse.   FAMILY HISTORY: The patient's mother had brain tumor, died in her 18s. The  patient's father lived to be 34 and had a questionable heart attack,  died in his sleep.   REVIEW OF SYSTEMS: Not easy to obtain since the patient is very poorly responsive. She denies any significant pain, however, admits of some discomfort in her abdomen. She admits of having need to urinate, however, otherwise, denies any fevers or chills. Admits of weakness and fatigue. No significant weight loss; however, the patient's family admits that the patient has very likely gained significant weight. Her usual weight is around 112. Now she weighs around 130. She has been gaining weight at least for the past 2 weeks. She admits of sore throat as well as some coughing and poor p.o. intake over the past few weeks at least and longer.  HEENT: Otherwise, denies any sinus congestion, postnasal drip.  RESPIRATORY: No shortness of breath, significant cough, or phlegm production. CARDIOVASCULAR: No chest pains. Denies any palpitations or arrhythmias. GASTROINTESTINAL: Abdominal pain. Denies nausea or vomiting, however, again, very difficult to get a history from her. The patient does have some problems with constipation. It is unclear how long ago she had any bowel movement.  GENITOURINARY: Not sure about urination issues. According to the patient's son, the patient's urinary output has decreased. MUSCULOSKELETAL: Has significant lower extremity swelling. Denies any joint swelling or pain in her lower extremities. SKIN: At this point, no skin problems rashes, lesions, or otherwise abnormalities. NEUROLOGIC: No weakness or numbness or other neurologic impairment. According to medical records, the patient usually has chronic lower back pain as well as numbness in her feet intermittently.  HEMATOLOGIC: Denies hematologic problems. No recent bleeding or easy bruising.   PHYSICAL EXAMINATION:  VITAL SIGNS: During my evaluation, temperature is 97.8. Pulse was 86. Respiration rate was 20, blood pressure 123/68. Saturation was 95% on 2 liters of oxygen through nasal cannula.  GENERAL: This  is a well-developed, well-nourished, pale, weak, sick-looking, elderly Caucasian female lying on the stretcher.  HEENT: Her pupils are equal, reactive to light. Extraocular movements are intact. No icterus or conjunctivitis. Has very difficult hearing. No pharyngeal erythema. Mucosa is moist.  NECK: No masses. Supple, nontender. Thyroid is not enlarged. No adenopathy. No JVD or carotid bruits bilaterally. Full range of motion.  LUNGS: Clear to auscultation anteriorly. A few crackles were heard at the bases posteriorly, as well as diminished breath sounds bilaterally posteriorly. No rales, rhonchi, or wheezing. No labored inspirations, increased effort, dullness to percussion, overt respiratory distress.  CARDIOVASCULAR: S1, S2 appreciated. No murmurs, gallops, or rubs were noted. Rhythm was regular. PMI not lateralized. Chest is nontender to palpation.  EXTREMITIES: Pedal pulses 1+. Significant lower extremity edema bilaterally was noted, approximately 3+. No calf tenderness or cyanosis was noted.  ABDOMEN: Has dressing in the mid below umbilical area; however, no significant tenderness on palpation was noted. No distention or significant bloating. It was soft to palpation. Bowel sounds are present, though but diminished. No hepatosplenomegaly or masses were noted.  RECTAL: Deferred.  MUSCULOSKELETAL: Muscle strength: Not able to assess, as the patient is not cooperative. Not able to assess her for kyphosis as well. She is not able to sit up. No joint abnormalities were noted as far as I could see at this time. SKIN: Otherwise did not show any ulcerations or lesions or other abnormalities. NEUROLOGICAL: The patient does not have dysarthria or aphasia. Not able to assess her strength. No obvious focal deficits, however, very difficult to examine as she is not cooperative. She is very somnolent, poorly cooperative, and  poorly oriented. Very weak and sleepy.   LABORATORY AND DIAGNOSTIC: The patient's EKG is  pending. The patient's lab data done on the 15th of December 2014 revealed a BUN and creatinine of 61 and 3.09, sodium level  low at 129; bicarbonate level is only 15. Estimated GFR for a non-African American would be 13. Phosphorus was 4.5, magnesium 1.6, and lipase level was 57. Liver enzymes revealed albumin level of 1.4, elevated total bilirubin of 3.3; direct bilirubin was 2.5; total protein was 5.4; otherwise, liver enzymes were unremarkable. TSH was normal at 4.09. White blood cell count was markedly elevated at 48.2; hemoglobin was 9.7 and platelet count was 435; MCV is high at 101. The patient had a urinalysis done which showed amber cloudy urine, negative for glucose, 1+ bilirubin, negative for ketones, specific gravity 1.014; pH was 7.0, 1+ blood, 30 mg/dL protein, negative for nitrites or leukocyte esterase, 45 red blood cells, 6 white blood cells, 1+ bacteria, 5 epithelial cells; white blood cell clumps as well as mucus was present; 7 hyaline casts were noted.   RADIOLOGIC STUDIES: Abdomen 3-way including PA of chest, 15th of December 2014, revealed enlargement of cardiac silhouette, large hiatal hernia, persistent left basilar atelectasis, and small pleural effusion. No acute abdominal findings were noted, and little change was found since prior study. CT scan of abdomen and pelvis without contrast, 15th of December 2014, revealed prior right hemicolectomy, no extravasation of contrast at the anastomosis to suggest anastomotic leak. There are, however, numerous fluid collections throughout the abdomen and pelvis as described above, including a subcapsular fluid collection adjacent to the right hepatic lobe. Moderate bilateral pleural effusions with compressive atelectasis were also noted, as well as small pericardial effusion. Large hiatal hernia, which also contains fluid ascites according to radiologist.   ASSESSMENT AND PLAN: 1.  Acidosis: Likely multifactorial due to poor perfusion, as well as  acute renal failure. We will get ABGs to determine best treatment plan; however, the patient's family up until now were reluctant to allow [place] to do arterial stick. Will follow up and will initiate bicarbonate IV drip or BiPAP if needed.  2.  Malnutrition, severe protein as well as calorie: Now will be on TPN by surgery as soon as PICC line is placed. Will follow the patient's protein levels.  3.  Elevated total bilirubin: Seems to be new. The patient's CT scan results reveal subcapsular edema. Will initiate Lasix and will watch the patient's total bilirubin levels. The patient may benefit from ultrasound of right upper quadrant as well; however, the patient has no significant pain on palpation.  4.  Anasarca: Very likely due to third spacing due to hypoalbuminemia. Normal ejection fraction on echocardiogram done in 2013. Will repeat it, especially now since a pericardial effusion was noted. Will also ask cardiologist to see patient. Will initiate the patient on Lasix IV and will follow creatinine closely.  5.  Leukocytosis: Of unclear etiology. No fevers were noted, and the patient's blood pressure is stable. The patient does not likely have sepsis or systemic inflammatory response reaction. However, I cannot rule out urinary tract infection, so will be initiating her on antibiotic therapy as soon as urine cultures are taken.  6.  Acute on chronic renal failure: We will get bladder scan. The patient will need to have Foley catheter placed. Will also get urine cultures as mentioned above. I will make decisions as follow.   TIME SPENT: One hour.  ____________________________ Theodoro Grist, MD rv:jcm D: 03/19/2013 16:37:34  ET T: 03/19/2013 19:18:53 ET JOB#: [id number]  cc: Theodoro Grist, MD, <Dictator> Meghan Tiemann MD ELECTRONICALLY SIGNED 03/31/2013 19:12

## 2014-07-26 NOTE — Discharge Summary (Signed)
PATIENT NAME:  Tanya Rice, Tanya Rice MR#:  [id number] DATE OF BIRTH:  Jul 29, 1926  DATE OF ADMISSION:  03/05/2013 DATE OF DISCHARGE:  03/16/2013  BRIEF HISTORY: [name] is an 81 year old woman referred by her primary care doctor for evaluation of a recently identified colon carcinoma. She has been having abdominal pain for some time and had intermittent abdominal symptoms. She has a history of myelodysplastic disease and had been anemic for an additional period of time. She was treated by the [facility] for her myelodysplastic disease and been treated with Procrit and hydroxyurea. Colonoscopy following CT scan demonstrated what appeared to be a large fungating cecal lesion, and she was referred to the surgical service for intervention. After appropriate preoperative preparation and informed consent she was taken to surgery on the morning 03/05/2013 where she underwent a robotic-assisted laparoscopic right colectomy. The procedure was uncomplicated. She had no significant intraoperative problems. Postoperatively, she had some mild problems with acute renal insufficiency with her creatinine peaking at 3.4. She did not have any oliguria noted maintaining urine output throughout her hospitalization. She was seen by the internal medicine service and by the nephrology service. They both assisted in her management. Gentle hydration was performed and her creatinine has come down to 2.4. She has significant hypoalbuminemia related to her long-standing illness and her recent surgery. She has seen some significant total body anasarca. She is tolerating a regular diet. She is having some bowel function. Pathology did demonstrate 2 of 11 lymph nodes with tumor and transmural involvement of the tumor. She has been followed by the [facility] service postoperatively and they will be involved in her postoperative evaluation as an outpatient. She is discharged to the [facility] center on the 12th for further physical  therapy and recovery rehab. She will be followed in our office in 7 to 10 days' time.   DISCHARGE MEDICATIONS: Include: 1.  Aspirin 81 mg p.o. daily. 2.  Levothyroxine 50 mcg once a day. 3.  Metoprolol 25 mg b.i.d. 4.  Protonix 40 mg b.i.d. 5.  Tramadol 50 mg every 6 hours p.r.n. 6.  Albuterol inhaler q. 4 hours p.r.n.  7.  Ensure 240 mL 2 to 3 times daily.   FINAL DISCHARGE DIAGNOSIS: Adenocarcinoma of the cecum.   SURGERY: Robotic-assisted laparoscopic right colectomy.  ____________________________ Micheline Maze, MD rle:sb D: 03/15/2013 16:18:00 ET T: 03/15/2013 16:58:51 ET JOB#: [id number]  cc: Micheline Maze, MD, <Dictator> Eduard Clos. Gilford Rile, MD Murlean Iba, MD Lupita Dawn. Candace Cruise, MD Rodena Goldmann MD ELECTRONICALLY SIGNED 03/16/2013 23:39

## 2014-07-26 NOTE — Discharge Summary (Signed)
PATIENT NAME:  Tanya Rice, Tanya Rice MR#:  [id number] DATE OF BIRTH:  December 26, 1926  DATE OF ADMISSION:  03/19/2013 DATE OF DISCHARGE:  03/23/2013   The patient was initially admitted on 03/18/2013 by Dr. Pat Patrick.   ADMISSION DIAGNOSIS: Weakness and total body anasarca.   DISCHARGE DIAGNOSES:  1. Acidosis.  2. Anasarca due to hypoalbuminemia.  3. Acute on chronic renal failure.  4. Pyuria.  5. Protein-calorie malnutrition.  6. Altered mental status.  7. Leukocytosis.  8. Anemia.  9. Hypertension.  10. Aspiration pneumonitis.   HOSPITAL COURSE: This is an 81 year old female who underwent robotic-assisted right colectomy for invasive adenocarcinoma on December 1st. She was discharged to rehabilitation, and she came back on December 14th for poor p.o. intake and anasarca. She was also found to have elevated creatinine. For further details, please refer to the H and P.   1. Acidosis secondary to renal etiology and acute renal failure, which improved with a bicarbonate drip.  2. Anasarca due to low albumin, severe protein calorie malnutrition. She was on IV Lasix drip as per renal consultation.  3. Acute on chronic renal failure. Has improved with IV Lasix.  4. Pyuria. No urinary tract infection. Her urine suggests contamination. 5. Protein calorie malnutrition. She was on TPN. Attempted NG tube, but this was unsuccessful. Her albumin level continued to trend down. She is not taking in any food.  6. Altered mental status, possibly metabolic encephalopathy versus just failure to thrive with protein calorie malnutrition and after her surgery. She remains lethargic, not very responsive. Family decided that the best thing for the patient would be hospice as she did not do well postoperatively.  7. Leukocytosis, improved with antibiotics.  8. Anemia, which is stable.  9. Hypotension on initial presentation, resolved.  10. Likely aspiration pneumonitis. The patient was on Levaquin.  11. The patient is not  doing well postoperatively, lethargic, not eating well. The family has decided on hospice care. THE PATIENT HAS NUMEROUS ALLERGIES TO MANY PAIN MEDICATIONS. She is being discharged with Ativan 0.5 mg q.2-4 hours as needed, with discharge diet as tolerated. The patient will be going to [facility].   TIME SPENT: Approximately 40 minutes.  ____________________________ Donell Beers. Benjie Karvonen, MD spm:lb D: 03/23/2013 13:36:01 ET T: 03/23/2013 14:07:08 ET JOB#: [id number]  cc: Flynn Lininger P. Benjie Karvonen, MD, <Dictator> Donell Beers Brigida Scotti MD ELECTRONICALLY SIGNED 03/23/2013 14:31

## 2014-07-26 NOTE — Consult Note (Signed)
History of Present Illness:  Reason for Consult Tanya Rice in Tanya Rice Tanya Rice status post hemicolectomy on December 1, 20120   HPI   81 year old lady with Tanya Rice, Tanya Rice, chronic renal insufficiency number of medical problems.underwent right hemicolectomy.  Final pathology report is pending.  Now being evaluated for anemia for which patient had 2 units of packed cell transfusionand 4 Tanya Rice platelet count has increased to 375,000 todaywas previously treated with Tanya Rice.  And Tanya Rice for anemia.morning feeling weak ,tired and has persistent postoperative nausea  PFSH:  Comments No family history of colorectal cancer, breast cancer, or ovarian cancer.   Additional Past Medical and Surgical History does not drink does not smoke patient's  past history, social, and family history is been reviewed from previous notes   Review of Systems:  General weakness  fatigue   Performance Status (Tanya Rice) 1   HEENT no complaints   Lungs no complaints   Cardiac no complaints   GI as per HPI   GU no complaints   Musculoskeletal no complaints   Extremities no complaints   Skin no complaints   Neuro no complaints   Psych anxiety   NURSING NOTES: **Vital Signs.:   02-Dec-14 05:02   Vital Signs Type: Routine   Temperature Temperature (F): 98   Celsius: 36.6   Temperature Source: oral   Pulse Pulse: 84   Respirations Respirations: 20   Systolic BP Systolic BP: 143   Diastolic BP (mmHg) Diastolic BP (mmHg): 66   Mean BP: 77   Pulse Ox % Pulse Ox %: 94   Pulse Ox Activity Level: At rest   Oxygen Delivery: 2L   Physical Exam:  General Patient is alert oriented lying in the bed.   HEENT: No jaundice.pale looking conjuctiva   Lungs: clear   Cardiac: tachycardia   Breast: not examined   Skin: intact   Extremities: No edema, rash or cyanosis   Neuro: AAOx3  cranial nerves intact   Psych:  alert and cooperative   Physical Exam Abdomen: Postoperative tenderness.  Difficult  to examine because of tenderness     anemia:    Hyperlipidemia:    Fibrocystic breast Rice:    Hypertension:    Allergic rhinitis:    arthritis knuckes:    Sonogram right ankle due to swelling: WNL   echocardiogram: WNL, Aug 2006   pelvic fx: Sep 2006   elevated platelets: has checked every three months; treated PO Rx, 2003   pelvic exam: WNL, Sep 2005   Bone marrow Bx; benign: Jun 2006   cataract surgery bilateral: 2000   hernia repair inguinal bilateral:    left shoulder rotator cuff surgery: Jun 2004   Tanya Rice: Rash  Morphine: Hives  Tanya Rice: Rash  Tanya Rice: Swelling  Tanya Rice: Rash  Tanya Rice: Rash  Tanya Rice: Rash  Tanya Rice: Unknown  Tanya Rice: Other, Anaphylaxis  Tanya Rice: Rash    Tanya Rice 25 mg oral tablet: 1 tab(s) orally 2 times a day, Status: Active, Quantity: 0, Refills: None   Tanya Rice 40 mg oral delayed release tablet: 1 tab(s) orally 2 times a day, Status: Active, Quantity: 0, Refills: None   Tanya Rice 81 mg oral tablet, chewable: 1 tab(s) orally once a day, Status: Active, Quantity: 0, Refills: None   Tanya Rice 500 mg oral capsule: 1 cap(s) orally 3 times a week (Mon-Wed- Fri), Status: Active, Quantity: 0, Refills: None   Tanya Rice 50 mcg (0.05 mg) oral tablet: 1 tab(s) orally once a day, Status: Active, Quantity: 0, Refills:  None  Laboratory Results: Routine Chem:  02-Dec-14 04:55   Glucose, Serum  152  BUN 14  Creatinine (comp)  1.90  Sodium, Serum  129  Potassium, Serum 3.9  Chloride, Serum 99  CO2, Serum  20  Calcium (Total), Serum  7.7  Anion Gap 10  Osmolality (calc) 262  eGFR (African American)  28  eGFR (Non-African American)  24 (eGFR values <60m/min/1.73 m2 may be an indication of chronic kidney Rice (CKD). Calculated eGFR is useful in patients with stable renal function. The eGFR calculation will not be reliable in  acutely ill patients when serum creatinine is changing rapidly. It is not useful in  patients on dialysis. The eGFR calculation may not be applicable to patients at the low and high extremes of body sizes, pregnant women, and vegetarians.)  Routine Hem:  02-Dec-14 04:55   WBC (CBC)  14.3  RBC (CBC)  2.81  Hemoglobin (CBC)  9.4  Hematocrit (CBC)  28.0  Platelet Count (CBC) 395  MCV 100  MCH 33.6  MCHC 33.7  RDW  27.5  Neutrophil % 80.7  Lymphocyte % 7.4  Monocyte % 11.8  Eosinophil % 0.0  Basophil % 0.1  Neutrophil #  11.6  Lymphocyte # 1.1  Monocyte #  1.7  Eosinophil # 0.0  Basophil # 0.0 (Result(s) reported on 06 Mar 2013 at 05:51AM.)   Assessment and Plan: Impression:   1. Tanya Rice.with thrombocytosiscount is 375,000. We will add Hydrea of 500 mg pump tomorrow he patient tolerates oral medicationmay benefit from either Tanya Rice or intravenous iron therapy I will closely monitor hemoglobin.  Target for platelet count is below 500,000should be 9 g for moremost likely secondary to Tanya Rice  Rice cancer further pathological report is being awaited efore any recommendation for postoperative adjuvant therapy is made Plan:   .......  CC Referral:  cc: Dr. JRonette Deter  Electronic Signatures: COliva Bustard JMartie Lee(MD)  (Signed 02-Dec-14 13:18)  Authored: HISTORY OF PRESENT ILLNESS, PFSH, ROS, NURSING NOTES, PE, PAST MEDICAL HISTORY, ALLERGIES, HOME MEDICATIONS, LABS, ASSESSMENT AND PLAN, CC Referring Physician   Last Updated: 02-Dec-14 13:18 by CJobe Gibbon(MD)

## 2014-07-26 NOTE — H&P (Signed)
PATIENT NAME:  Tanya Rice, Tanya Rice MR#:  [id number] DATE OF BIRTH:  17-Jul-1926  DATE OF ADMISSION:  03/18/2013  PRIMARY CARE PHYSICIAN: Ronette Deter, MD   ONCOLOGIST: Forest Gleason, MD   ADMITTING PHYSICIAN: Rodena Goldmann III, MD  CHIEF COMPLAINT: Weakness, total body anasarca.   BRIEF HISTORY: This patient is an 81-year-old woman, admitted to the hospital on December 1 for elective laparoscopic-assisted right colectomy. She had had six months of abdominal pain and failure to thrive symptoms with profound anemia. Work-up with endoscopy and CT scan demonstrated a right colon lesion, which on biopsy was invasive adenocarcinoma. After appropriate preoperative preparation, she was taken to surgery on the morning of 03/05/2013, where she underwent a robot-assisted laparoscopic right colectomy. The procedure was uncomplicated. She had no significant intraoperative problems. Postoperatively, she had some mild acute renal insufficiency which was felt to be related to acute tubular necrosis. She had some underlying renal disease which was exacerbated by her fluid changes with the prep and surgery. She was transfused postoperatively. She was seen by the oncology service and the renal service. She had very poor nutrition initially, and very slow return of bowel function. However, over the course of her hospitalization, her bowel function improved. She was able to take a regular diet supplemented by Ensure. Her renal insufficiency stabilized and at the time of discharge her creatinine was 2.42, down from a high of 3.5. She was discharged to rehab December 12. The rehab center noted this evening that she was weak and cold, confused, and referred her back urgently to the Emergency Room.   In the Emergency Room, her vital signs demonstrated a blood pressure of 125/55, pulse rate of 80, respirations 16, oxygen saturation 95% on room air and temperature 97.3. Laboratory values are still pending. Plain films demonstrated  what appeared to be a mild ileus pattern, although there is clear gas in the colon. She has been moving her bowels, but not voiding normally. Family relates she has not been eating very well over the last 48 hours. Because of these findings, total body anasarca, generalized weakness, acute renal insufficiency, chronic renal insufficiency and colon carcinoma with myelodysplastic disease and thrombocytosis, she is re-admitted to the hospital for further intervention.    ADMISSION MEDICATIONS: Albuterol inhaler 2.5 mg q.4h. p.r.n., aspirin 81 mg p.o. daily,  hydroxyurea 500 mg once a day, levothyroxine 50 mcg once a day, metoprolol 25 mg twice a day, Protonix 40 mg once a day, tramadol 50 mg every six hours p.r.n., Tylenol 325 to 650 q.4-6h. p.r.n. fever or pain.   ALLERGIES: SHE IS ALLERGIC TO AUGMENTIN, MORPHINE, PENICILLIN, SULFA, HYDROCHLOROTHIAZIDE AND VICODIN.   SOCIAL HISTORY: She is not a cigarette smoker, does not drink alcohol.   REVIEW OF SYSTEMS: Otherwise unremarkable.   PHYSICAL EXAMINATION: VITAL SIGNS:  As noted above. HEENT: No scleral icterus. No pupillary abnormalities. No facial deformities. She is very weak and washed out.  NECK: Supple, nontender, with no adenopathy. Midline trachea.  CHEST: Clear, with no adventitious sounds. She has normal pulmonary excursion.  CARDIAC: No murmurs or gallops. She seems to be in normal sinus rhythm.  ABDOMEN: Her abdomen is soft, nontender with no significant wound problems. She does have some mild drainage. She does have total body anasarca, primarily in her hips and thighs as the focal point, but all over elbows, ankles, and buttocks. She is draining some clear fluid through her lower abdominal incision.  EXTREMITIES:  She has depressed distal pulses.  PSYCHIATRIC: Obvious confusion,  disorientation. She appears to be quite depressed.   IMPRESSION: This woman appears to be having problems with failure to thrive, malnutrition, generalized  weakness and confusion post surgery. Will plan to readmit her to the hospital, start TPN and arrange for oncology, internal medicine and renal evaluations. She will likely need a PICC line for central venous access for long-term TPN. This plan has been discussed with the patient and her family. They are in agreement.    ____________________________ Micheline Maze, MD rle:cg D: 03/19/2013 00:53:41 ET T: 03/19/2013 01:16:03 ET JOB#: [id number]  cc: Micheline Maze, MD, <Dictator> Eduard Clos. Gilford Rile, MD Dr. Candiss Norse, renal service Martie Lee. Oliva Bustard, MD Rodena Goldmann MD ELECTRONICALLY SIGNED 03/28/2013 17:28

## 2014-07-28 NOTE — Consult Note (Signed)
PATIENT NAME:  Tanya Rice, Tanya Rice MR#:  [id number] DATE OF BIRTH:  1926/06/17  DATE OF CONSULTATION:  05/01/2011  REFERRING PHYSICIAN:   CONSULTING PHYSICIAN:  Lupita Dawn. Candace Cruise, MD  REASON FOR REFERRAL: Epigastric pain.   DESCRIPTION: The patient is an 81 year old white female with a history of chronic kidney disease and myeloproliferative disorder and hypertension, hyperlipidemia who started having significant epigastric pain about a week ago. She was on tapering doses of prednisone for chronic back pain. It was when she finished the prednisone after about 2 weeks when she started having epigastric pain associated with some nausea and anorexia. She denies seeing any gross hematochezia or melena. She recalls having similar episode in July of 2011 when she was found to have epigastric ulcers. Because of the similarity of symptoms, she decided to call for evaluation. She was on prednisone taper and gabapentin for her back pain that was radiating to her leg. She decided to start taking Prilosec on her own at home. Unfortunately it was not helping. In the emergency room, chest x-ray was done which showed a large hiatal hernia. She was also complaining of some nausea and vomiting before admission.   REVIEW OF SYMPTOMS:  There are no fevers or chills. There is no chest pain or palpitations. There is no coughing or shortness of breath. The GI symptoms have been described already.   PAST MEDICAL HISTORY:  Chronic renal disease. She also has myeloproliferative disorder that is followed by Dr. Oliva Bustard. She has chronic back pain. Other history includes hypertension, hyperlipidemia, vertigo, hypothyroidism, and history of C. difficile.   PAST SURGICAL HISTORY: Includes cataract and shoulder surgery and inguinal hernia repair.   ALLERGIES: She is allergic to morphine, penicillin, sulfa, triamterene, Vicodin and Augmentin.    CURRENT MEDICATION IN THE HOME: Baby aspirin, Allegra, Centrum, gabapentin, hydroxyurea,  Lunesta, metoprolol, Prilosec, Synthroid, Lunesta.   SOCIAL HISTORY: She denies any smoking or alcohol.   FAMILY HISTORY:  There is family history of heart attack and brain tumor.   PHYSICAL EXAMINATION:  GENERAL: The patient appears to be in no acute distress right now.   VITAL SIGNS: She is afebrile with temperature of 98.1, pulse 70, respirations 18, blood pressure 119/74, pulse oximetry 94.   HEENT: Normocephalic, atraumatic head. Pupils are equally reactive. Throat was clear.   NECK: Supple.   CARDIAC: Regular rhythm and rate without any murmurs.   LUNGS: Clear bilaterally.   ABDOMEN: Normoactive bowel sounds, soft. There is some mild tenderness in the epigastric region. There is no hepatomegaly. There are no palpable masses.   EXTREMITIES: No clubbing, cyanosis, or edema.   LABORATORIES:  Today, sodium 138, potassium 4.1, chloride 100, CO2 of 27, BUN 22, creatinine 1.27, glucose is 87. Liver enzymes were completely normal. Troponin level is slightly elevated at 0.16 on admission and now it is 0.17 today. White count is 9.6, hemoglobin is 9.4, it was 10.7 on admission. Urinalysis showed some leukocytes and WBCs.   IMPRESSION AND PLAN: This is a patient with recurrent epigastric pain with a known history of ulcer disease. This may be exacerbated by recent prednisone use, but she also takes baby aspirin as well. I agree with placing her on some Protonix. She may have recurrence of ulcers. I discussed doing an endoscopy on her on Monday. Patient does not know whether she wants to stay in if she is doing well. I wrote some regular diet for her. If she can tolerate that well without dropping a significant drop in hemoglobin  or worsening of her symptoms, she can be discharged to home on either Prilosec b.i.d. or Protonix b.i.d., and then we can arrange an outpatient           gastroscopy for her this coming week. On the other hand, if she requires further hospitalization then we  will try to do the endoscopy on Monday afternoon.   Thank you for the referral.      ____________________________ Lupita Dawn. Candace Cruise, MD pyo:vtd D: 05/01/2011 10:35:27 ET T: 05/01/2011 11:23:31 ET JOB#: [id number]  cc: Lupita Dawn. Candace Cruise, MD, <Dictator> Lupita Dawn Sabrine Patchen MD ELECTRONICALLY SIGNED 05/04/2011 8:50

## 2014-07-28 NOTE — H&P (Signed)
PATIENT NAME:  Tanya Rice, SPIZZIRRI MR#:  [id number] DATE OF BIRTH:  November 18, 1926  DATE OF ADMISSION:  04/30/2011  ADMITTING PHYSICIAN: Gladstone Lighter, MD    PRIMARY CARE PHYSICIAN: Was Dr. Sherald Barge, but since Dr. Gorden Harms moved the patient will be seeing Dr. Ronette Deter, and she has her first appointment scheduled this February.   CHIEF COMPLAINT: Epigastric pain.   HISTORY OF PRESENT ILLNESS: Tanya Rice is an 81 year old Caucasian female with past medical history significant for chronic kidney disease, stage III, with baseline creatinine around 1.4 to 1.6, myeloproliferative disorder with thrombocytosis-on chronic hydroxyurea treatment, chronic low back pain, hypertension, hyperlipidemia, comes to the hospital complaining of epigastric pain that started almost about a week ago. The patient says she had a similar admission in July 2011 and was found to have gastric ulcers at that time.   The patient has been having worsening low back pain with pain radiating to her right leg, so she has seen Dr. Oliva Bustard, who has started her on a prednisone taper for 12 days and on gabapentin 3 times a day. She feels like since her prednisone was started she has been experiencing some discomfort in the epigastric region which gets worse after eating. Of note, because of the persistent epigastric pain since prednisone was started, the patient's daughter started giving her Prilosec for the last three days with not much improvement. The pain went up to as high as 8 out of 10, and this morning she could not take the pain anymore. She called Dr. Sherald Barge' PA, who also with Dr. Eulogio Ditch office, and it seems like they advised her to come to the Emergency Room. Chest x-ray does show that she has a moderately large hiatal hernia. She also complains of nausea and an episode of vomiting associated with dizziness about three days ago. No diarrhea, positive for constipation. No fevers or chills lately. Pain  does not radiate anywhere at this time. She was found to have an elevated troponin of 0.16 in the ED. Of note, her troponin in her last admission was also elevated at 0.11, probably partially from her renal insufficiency. So she is being admitted for management of her epigastric pain and also elevated troponins.   PAST MEDICAL HISTORY:  1. History of Clostridium difficile colitis in 2006.  2. Chronic kidney disease, stage III, with baseline creatinine around 1.4 to 1.6.  3. Myeloproliferative disorder with thrombocytosis-on chronic hydroxyurea therapy, followed by Dr. Oliva Bustard.  4. Chronic low back pain with chronic headaches and pain radiating down her right leg.  5. Hypertension.  6. Hyperlipidemia.  7. Anemia of chronic kidney disease.  8. Recurrent positional vertigo.  9. Hypothyroidism.  10. Allergic rhinitis.   PAST SURGICAL HISTORY:  1. Left shoulder rotator cuff surgery.  2. Cataract surgery.  3. Bilateral inguinal hernia repairs.   ALLERGIES: She is allergic to Augmentin, morphine, penicillin, sulfa drugs, triamterene/HCTZ, Vicodin,  apples, peaches and soy.   CURRENT MEDICATIONS:  1. Aspirin 81 mg p.o. daily.  2. Allegra 180 mg p.o. daily.  3. Caltrate 600 mg +400 international units 1 tablet p.o. daily.  4. Centrum Silver daily.  5. Gabapentin 100 mg p.o. t.i.d.  6. Hydroxyurea 500 mg p.o. daily on Tuesdays, Thursdays, Saturdays and Sundays, and 2 tablets on Mondays, Wednesdays, and Fridays.  7. Lunesta  2 mg p.o. at bedtime.  8. Meclizine 25 mg p.o. t.i.d. p.r.n. for dizziness.  9. Metoprolol 25 mg p.o. b.i.d.  10. Prilosec 20 mg p.o. daily.  11. Synthroid 25 mcg p.o. daily.   SOCIAL HISTORY: She lives at home by herself, pretty active at baseline. No history of any alcohol or smoking.   FAMILY HISTORY: Mom with brain tumor, died in her 31s. Father lived up to 92 and passed probably from old age versus a massive heart attack.     REVIEW OF SYSTEMS: CONSTITUTIONAL: No  fever, fatigue, or weakness. EYES: No blurred vision, double vision, cataracts or glaucoma. ENT: Positive for hearing loss and wears hearing aids. No tinnitus, ear pain, epistaxis or discharge. RESPIRATORY: No cough, wheeze, hemoptysis, or chronic obstructive pulmonary disease. CARDIOVASCULAR: No chest pain, orthopnea, edema, arrhythmia, palpitations, or syncope. GI: Positive for nausea and epigastric pain, one episode of vomiting. No diarrhea. Positive for constipation. GENITOURINARY: No dysuria, hematochezia, renal calculus, frequency, or incontinence. ENDOCRINE: No polyuria, nocturia, thyroid problems, heat or cold intolerance. HEMATOLOGY: No anemia, easy bruising or bleeding. SKIN: No acne, rash, or lesions. MUSCULOSKELETAL: No neck, back, shoulder pain, arthritis. Positive for low back pain. NEUROLOGICAL: No numbness, weakness, cerebrovascular accident, transient ischemic attack, or seizures. PSYCHOLOGICAL: No anxiety, insomnia, or depression.   PHYSICAL EXAMINATION:  VITAL SIGNS: Temperature afebrile, pulse 80, respirations 22, blood pressure 170/77, pulse oximetry 90% on room air.   GENERAL: A well-built, well-nourished female lying in bed, not in any acute distress.   HEENT: Normocephalic, atraumatic. Pupils are equal, round, reacting to light. Anicteric sclerae. Extraocular movements intact. Oropharynx clear without erythema, mass, or exudates.   NECK: Supple. No thyromegaly, JVD or carotid bruits. No lymphadenopathy.   LUNGS: Clear to auscultation bilaterally. No wheeze or crackles. No use of accessory muscles for breathing.   CARDIOVASCULAR: S1, S2 regular rate and rhythm. No murmurs, rubs, or gallops.   ABDOMEN: Soft, mild discomfort in epigastric region, nontender, no hepatosplenomegaly. Normal bowel sounds.   EXTREMITIES: No pedal edema. No clubbing or cyanosis. 2+ dorsalis pulses palpable bilaterally.   SKIN: No acne, rash, or lesions.   LYMPHATICS: No cervical or inguinal  lymphadenopathy.   NEUROLOGICAL: Cranial nerves are intact. No focal motor or sensory deficits.   PSYCHOLOGICAL: The patient is awake, alert, oriented x3.   LABORATORY, DIAGNOSTIC AND RADIOLOGICAL DATA:  WBC 10.3, hemoglobin 10.7, hematocrit 30.5, platelet count 1034.  Sodium 138, potassium not reported, chloride 100, bicarbonate 26, BUN 26, creatinine 1.48, glucose 93, calcium 8.9.  ALT 18, alkaline phosphatase 45, total bilirubin 0.3, albumin 3.2, lipase 100.  Troponin 0.16.  Chest and abdominal x-ray showing findings consistent with chronic obstructive pulmonary disease. A moderately large hiatal hernia is present. No bowel obstruction or perforation is evident.  EKG showing normal sinus rhythm, heart rate of 71. No acute ST-T wave abnormalities seen.   ASSESSMENT AND PLAN: An 81 year old female with a history of myeloproliferative disorder with polycythemia-on hydroxyurea, hypertension, chronic kidney disease, stage III, history of Clostridium difficile colitis in 2006, comes in with epigastric pain worsened today. She also was started on prednisone taper for low back pain along with gabapentin recently, and chest x-ray is showing a moderately large hiatal hernia and also elevated troponin.   1. Chest pain/epigastric pain: Prior history of gastric ulcers from EGD in July of 2011. The pain sounds more like gastritis or ulcer disease which is exacerbated by prednisone rather than cardiac at this point. We will start her on IV Protonix b.i.d., get a GI consult by Dr. Dionne Milo. Hold prednisone and aspirin, not any NSAIDs at this time.  2. Borderline elevated troponin: Borderline elevated troponin was present in the  past also. Probably from renal insufficiency and also high blood pressure. Last Myoview was in July 2011 by Dr. Rockey Situ when she presented with a similar pain. I will recycle cardiac enzymes, however, and monitor her on a telemetry floor. Avoid starting any IV heparin or Lovenox unless  troponins are further rising. We will get a plain 2-D echo for any wall motion abnormalities and also get Cardiology consult by Dr. Rockey Situ.  3. Malignant hypertension: Continue metoprolol and also add IV hydralazine p.r.n.  4. Myeloproliferative disorder: She follows up with Dr. Oliva Bustard. Last visit was on 04/22/2011. Hydroxyurea was recently increased for elevated platelets, and they have improved since last week. Continue outpatient medications.  5. Chronic kidney disease, stage III, appears stable at this time. Creatinine is at baseline.  6. GI and deep vein thrombosis prophylaxis: She is on IV Protonix b.i.d. and Lovenox daily.   CODE STATUS:  FULL CODE.   TIME SPENT ON ADMISSION: 50 minutes.  ____________________________ Gladstone Lighter, MD rk:cbb D: 04/30/2011 11:47:58 ET T: 04/30/2011 12:28:35 ET JOB#: [id number]  cc: Gladstone Lighter, MD, <Dictator> Minna Merritts, MD Eduard Clos Gilford Rile, MD Martie Lee. Oliva Bustard, MD Gladstone Lighter MD ELECTRONICALLY SIGNED 04/30/2011 14:44

## 2014-07-28 NOTE — Discharge Summary (Signed)
PATIENT NAME:  Tanya Rice, Tanya Rice MR#:  [id number] DATE OF BIRTH:  05/28/1926  DATE OF ADMISSION:  04/30/2011 DATE OF DISCHARGE:  05/03/2011  DISCHARGE DIAGNOSES:  1. Chest pain, epigastric pain secondary to gastric ulcer and large hiatal hernia.  2. Hypothyroidism.  3. Hypertension.  4. Vertigo.  5. Essential thrombocytosis.  6. Right leg radiculopathy.  7. Insomnia.   DISCHARGE MEDICATIONS:  1. Caltrate 600/400 one tablet p.o. daily, that is Caltrate with Vitamin D.  2. Hydrea 500 mg capsule once a day, 2 pills on Monday and Thursday. 3. Allegra 180 mg p.o. daily.  4. Synthroid 25 mcg p.o. daily.  5. Meclizine 25 mg p.o. t.i.d.  6. Aspirin 81 mg p.o. daily.  7. Prilosec 20 mg p.o. daily.  8. Lunesta 2 mg p.o. daily. 9. Gabapentin 100 mg p.o. t.i.d.   NEW MEDICINE: Protonix 40 mg p.o. b.i.d.   DO NOT TAKE: The patient was advised to avoid aspirin and also ibuprofen.   DIET: Low sodium diet.   FOLLOW-UP: The patient will follow-up with Dr. Candace Cruise in 3 to 4 weeks.   CONSULTATION: GI consult with Dr. Candace Cruise    PROCEDURE: Endoscopy today which showed gastric ulcer which is not bleeding and also hiatal hernia.   HOSPITAL COURSE: The patient is an 81 year old female patient with history of gastric ulcer, chronic kidney disease stage III, baseline creatinine 1.6, history of thrombocytosis, chronic hydroxyurea, chronic low back pain, hypertension, hyperlipidemia, and hypothyroidism who came in because of epigastric pain that started a week ago. She was started on prednisone and Neurontin for her back pain. After she started taking the prednisone, she started to feel epigastric pain. She did take Prilosec at home without much relief so she came in because she had history of gastric ulcer. The patient seen by Dr. Candace Cruise. We started her on PPIs and also IV fluids initially. The patient's hemoglobin stayed stable. Initial hemoglobin was 10.7. The next day the hemoglobin was 9.4 but she actually felt  better and tolerated a solid diet that was advanced by Dr. Candace Cruise. Because she lives alone, wanted to have the EGD in the hospital and that is the reason we did not discharge. The patient had an EGD today which showed normal esophagus, hiatal hernia, and gastric ulcer and H. pylori was sent. The patient was advised to continue PPIs. The plan is to follow with Dr. Candace Cruise regarding H. pylori result and biopsy results. She was advised to continue PPIs and avoid aspirin and ibuprofen containing products. The patient also had mild troponin elevation of 0.17 without any chest pain. Echocardiogram showed EF of more than 55%. The patient's EKG did not show any acute ST-T changes. Mild troponin elevation was thought to be secondary to abdominal pain and epigastric pain on top of chronic kidney disease. The patient remained asymptomatic so no further cardiac intervention was obtained. The patient's creatinine has been around 1.27, BUN of 22 on January 26th. The rest of the labs are within normal limits. Blood pressure this morning 139/75, pulse 61. The patient was seen in the endoscopy suite and she is status post EGD, tolerating liquids, and denies any complaints and eager to go home.   Discussed the plan with the patient's daughter at bedside and will discharge the patient home and follow-up with her primary doctor as well GI doctor, Dr. Candace Cruise. The patient has been seeing Dr. Sherald Barge but she moved so she has a primary doctor, Dr. Ronette Deter.   TOTAL TIME SPENT  ON DISCHARGE PREPARATION: More than 30 minutes.   ____________________________ Epifanio Lesches, MD sk:drc D: 05/03/2011 14:02:28 ET T: 05/03/2011 14:18:08 ET JOB#: [id number]  cc: Epifanio Lesches, MD, <Dictator> Eduard Clos. Gilford Rile, MD Epifanio Lesches MD ELECTRONICALLY SIGNED 05/18/2011 14:52

## 2014-07-28 NOTE — H&P (Signed)
PATIENT NAME:  Tanya Rice, SHIPLETT MR#:  [id number] DATE OF BIRTH:  1927-01-19  DATE OF ADMISSION:  07/08/2011  REFERRING PHYSICIAN: Dr. Robet Leu PRIMARY CARE PHYSICIAN: Dr. Ronette Deter PRIMARY ONCOLOGIST: Dr. Oliva Bustard  PRESENTING COMPLAINT: Upper abdominal pain, nausea, poor appetite, weight loss.   HISTORY OF PRESENT ILLNESS: Tanya Rice is a pleasant 81 year old woman with history of myeloproliferative disorder/thrombocytosis, chronic kidney disease stage III, degenerative disease and chronic low back pain, hypertension, hyperlipidemia, anemia presenting with complaints of developing upper abdominal pain for at least one week with nausea and loss of appetite and weight loss of approximately 7 pounds. Reports that she has been treated for urinary tract infection. Has been intermittently seen in clinic for her abdominal complaints. Reports that her symptoms have not improved. Reports also that she had a recent bone marrow biopsy approximately two weeks ago and also has had her hydroxyurea discontinued by her primary oncologist, Dr. Oliva Bustard. She denies any chest pain, palpitations, syncope. No diarrhea or bloody stools. No hematuria. No orthopnea or PND.   PAST MEDICAL HISTORY:  1. Admitted 01/25 to 05/03/2011 with epigastric pain secondary to gastric ulcer and large hiatal hernia. Patient had an EGD done in at that time.  2. Questionable transient ischemic attack.  3. History of C. difficile colitis in 2006.  4. Chronic kidney disease, stage III, baseline creatinine between 1.4 to 1.6.  5. Myeloproliferative disorder/thrombocytosis; was on hydroxyurea therapy but recently discontinued a month ago. 6. Degenerative disc disease and chronic low back pain with radiculopathy.  7. Chronic headaches.  8. Hypertension.  9. Hyperlipidemia.  10. Anemia of chronic kidney disease.  11. Benign positional vertigo.  12. Hypothyroidism.  13. Allergic rhinitis.  14. Insomnia.   PAST SURGICAL HISTORY:   1. Left shoulder rotator cuff surgery.  2. Cataract surgery.  3. Bilateral inguinal hernia repairs.   ALLERGIES: Augmentin, morphine, penicillin, sulfa drugs, triamterene, hydrochlorothiazide, UAA, Vicodin, apple, peach, soy.   MEDICATIONS:  1. Caltrate 600/400, 1 tablet daily.  2. Allegra 180 mg daily.  3. Metoprolol 25 mg b.i.d.  4. Synthroid 25 mcg daily.  5. Meclizine 25 mg t.i.d. as needed.  6. Protonix 40 mg b.i.d.  7. Lunesta 2 mg at bedtime as needed.  8. Gabapentin 100 mg q.a.m. and noon and 200 mg at bedtime.  9. Ciprofloxacin b.i.d. She has one more day dosing for recent treatment of urinary tract infection.  10. Questionable Mirapex as needed.   SOCIAL HISTORY: She lives in [place] alone. She denies any tobacco, alcohol or drug use.   FAMILY HISTORY: Mother had brain tumor, died in her 49s. Father lived to be 35 and questionable heart attack, died in his sleep.    REVIEW OF SYSTEMS: CONSTITUTIONAL: No fevers. Endorses nausea and anorexia. EYES: No glaucoma. She has a history of cataracts. ENT: No discharge, epistaxis or dysphagia. RESPIRATORY: No cough, wheezing, hemoptysis. CARDIOVASCULAR: No chest pain, orthopnea, edema, palpitations, or syncope. GASTROINTESTINAL: Endorses nausea but no vomiting. She has constipation. No hematemesis or melena. GENITOURINARY: No dysuria, hematuria, Recent treatment for urinary tract infection. Endo no polyuria or polydipsia. HEMATOLOGIC: No easy bleeding. She has easy bruising. SKIN: No ulcers. MUSCULOSKELETAL: She has chronic low back pain. NEUROLOGIC: No one-sided weakness or numbness. She has numbness in her feet intermittently. PSYCH: Denies any depression or suicidal ideation.   PHYSICAL EXAMINATION:  VITAL SIGNS: Temperature 98.1, pulse 88, respiratory rate 18, blood pressure 130/61, sating 95% on room air.   GENERAL: Lying in bed in no apparent distress.  HEENT: Normocephalic, atraumatic. Pupils equal, symmetric, nonicteric.  Nares without discharge. Moist mucous membranes.   NECK: Soft and supple. No adenopathy. She has slightly elevated JVP of approximately 7 cm.   CARDIOVASCULAR: Non-tachy. No murmurs, rubs, or gallops.   LUNGS: Clear to auscultation bilaterally. No use of accessory muscles or increased respiratory effort.   ABDOMEN: Soft. Positive bowel sounds. She has tenderness mainly in the upper mid abdomen. No rebound or guarding. Mildly distended.    EXTREMITIES: Trace edema bilaterally. Dorsal pedis pulses intact.   MUSCULOSKELETAL: No joint effusion.   SKIN: No ulcers.   NEUROLOGIC: No dysarthria or aphasia. Symmetrical strength. No focal deficits.   PSYCH: She is alert and oriented. Patient is cooperative.   LABORATORY, DIAGNOSTIC AND RADIOLOGICAL DATA: WBC 30.5, hemoglobin 9.1, hematocrit 28, platelets 794, MCV 105, glucose 103, BUN 19, creatinine 1.44, sodium 122, potassium 4.1, chloride 90, carbon dioxide 19, calcium 8.2, total bilirubin 0.8, alkaline phosphatase 141, ALT 157, AST 79, total protein 7.4, albumin 2.8, lipase 106. Urinalysis with specific gravity of 1.005, pH 5, 1 per high-power field RBC, 1 per high-power field WBC. PTT 33.4. Ultrasound of the abdomen with renal cortex somewhat thin bilaterally. There is increased echo texture in the kidney suggesting medical renal disease. The portal veins appear to be thrombosed and are mildly prominent in diameter. EKG with sinus rate of 80. There is T wave inversion. No ST elevation or depression.   ASSESSMENT AND PLAN: Tanya Rice is an 81 year old woman with history of myeloproliferative disorder/thrombocytosis, degenerative disk disease, chronic low back pain, hypertension, hyperlipidemia, chronic anemia, hypothyroidism, recent gastric ulcer presenting with complaints of abdominal pain, poor p.o. intake and nausea.  1. Abdominal pain, nausea, weight loss and anorexia in the setting likely of portal vein thrombosis, more than likely in the  setting of myeloproliferative disorder/thrombocytosis. Will send a lactate and AFP level. Additional work-up including hypercoagulable work-up per oncology. Surgery is also on board. Will continue heparin drip. Follow transaminitis. Clear diet. IV fluids. Symptom control. Admit to Critical Care Unit for close monitoring on heparin drip given recent diagnosis of gastric ulcer. Continue Protonix b.i.d. Can transfer to the floor once her hemoglobin appears to be stable.  2. Hyponatremia likely hypovolemia and poor p.o. intake. Continue normal saline IV fluids. Serial sodium levels. Send TSH and uric acid level.  3. History of myeloproliferative disorder/thrombocytosis. Had hydroxyurea recently discontinued by Dr. Oliva Bustard a month ago. Reports last bone marrow biopsy about one week ago per patient. Again oncology consultation. Leukocytosis likely exacerbated by stress state versus being off her hydroxyurea. Hold off on antibiotics for now. Her urinalysis is clean.  4. Chronic kidney disease, creatinine is at baseline. As about send lactate level for mild acidosis.  5. Hypothyroidism. Restart Synthroid.  6. Prophylaxis. On Protonix and heparin drip.   TIME SPENT: Approximately 55 minutes spent on patient care.    ____________________________ Rita Ohara, MD ap:cms D: 07/08/2011 01:20:25 ET T: 07/08/2011 07:42:26 ET JOB#: [id number]  cc: Albion Weatherholtz, MD, <Dictator> Anderson Malta A. Gilford Rile, MD Rita Ohara MD ELECTRONICALLY SIGNED 08/01/2011 1:55

## 2014-07-28 NOTE — Consult Note (Signed)
History of Present Illness:   Reason for Consult Myeloproliferative disease with thrombocythemia   Portal vein thrombosis    HPI   4: [id number] Patient is a known patient will be with myeloproliferative disease.  Started having increasing abdominal discomfort pain.  The pain was very severe patient was admitted in the hospital further evaluation review portal vein thrombosis.  Recently patient had a progressive anemia also was taken off Hydrea.  Patient has now progressive increasing platelet count Feeling somewhat better.nausea vomiting is improving.  Abdominal pain is improving.  PFSH:   Additional Past Medical and Surgical History does not drink does not smoke patient's  past history, social, and family history is been reviewed from previous notes   Review of Systems:   General weakness    Performance Status (ECOG) 1    HEENT no complaints    Lungs SOB    Cardiac no complaints    GI nausea/vomiting  Abdominal pain    GU no complaints    Musculoskeletal back pain    Extremities no complaints    Skin no complaints    Neuro no complaints    Psych anxiety  depression    Review of Systems   as per history of present illness  NURSING NOTES: **Vital Signs.:   04-Apr-13 10:20    Vital Signs Type: Upon Transfer    Temperature Temperature (F): 97.9    Celsius: 36.6    Temperature Source: oral    Pulse Pulse: 63    Pulse source: per Dinamap    Respirations Respirations: 20    Systolic BP Systolic BP: 474    Diastolic BP (mmHg) Diastolic BP (mmHg): 73    Mean BP: 94    BP Source: Dinamap    Pulse Ox % Pulse Ox %: 96    Oxygen Delivery: Room Air/ 21 %   Physical Exam:   General Patient is alert and oriented not in any acute distress    HEENT: Pain looking sclera    Lungs: clear    Cardiac: regular rate, rhythm    Abdomen: soft  positive bowel sounds  Tenderness in epigastric area    Skin: intact    Extremities: No edema, rash or cyanosis     Neuro: AAOx3  cranial nerves intact    Psych: normal appearance    Physical Exam LYMPHATICS:   No cervical, axillary, or inguinal lymphadenopathy     anemia:    Hyperlipidemia:    Fibrocystic breast disease:    Hypertension:    Allergic rhinitis:    arthritis knuckes:    Sonogram right ankle due to swelling: WNL   echocardiogram: WNL, Aug 2006   pelvic fx: Sep 2006   elevated platelets: has checked every three months; treated PO Rx, 2003   pelvic exam: WNL, Sep 2005   Bone marrow Bx; benign: Jun 2006   cataract surgery bilateral: 2000   hernia repair inguinal bilateral:    left shoulder rotator cuff surgery: Jun 2004   Sulfa drugs: Rash  Morphine: Hives  Augmentin: Rash  Triamterene/ HCTZ: Swelling  PCN: Rash  Apple: Rash  Peach: Rash  Vicodin: Unknown  UAA: Unknown  Soy: Unknown  Assessment and Plan:  Impression:   1. myeloproliferative disease  Anemia Leukocytosisbone marrow aspiration and biopsy continues to show evidence for myeloproliferative disease without myelofibrosis Increasing platelet count may be contributing to portal vein thrombosis Continue Hydrea thousand milligrams daily and 500 mg daily monitoring blood count.  We would like to reduce  platelet count to less than 500,000.  Continue anti-coagulation treatment may need Coumadin therapy for 6 months or more.   CC Referral:   cc: Dr. Ronette Deter   Electronic Signatures: Oliva Bustard, Martie Lee (MD)  (Signed 04-Apr-13 15:50)  Authored: HISTORY OF PRESENT ILLNESS, PFSH, ROS, NURSING NOTES, PE, PAST MEDICAL HISTORY, ALLERGIES, ASSESSMENT AND PLAN, CC Referring Physician   Last Updated: 04-Apr-13 15:50 by Jobe Gibbon (MD)

## 2014-07-28 NOTE — Consult Note (Signed)
Full consult to follow. Epig pain after 12 day course of prednisone. Hx of PUD in 2011. Protonix started. Likely has recurrence of PUD. Discussed scheduling EGD on Monday. However, if patient can tolerate reg diet today with signif increase in pain or drop in hgb, patient can be discharged later today on PPI bid. We can arrange EGD as outpt next week. Thanks  Electronic Signatures: Verdie Shire (MD)  (Signed on 26-Jan-13 09:16)  Authored  Last Updated: 26-Jan-13 09:16 by Verdie Shire (MD)

## 2014-07-28 NOTE — Consult Note (Signed)
General Aspect 81 year old Caucasian female with PMH significant for chronic kidney disease, stage III, with baseline creatinine around 1.4 to 1.6, myeloproliferative disorder with thrombocytosis, on chronic hydroxyurea treatment, h/o gastric ulcers, chronic low back pain, hypertension, hyperlipidemia, comes to the hospital complaining of epigastric pain that started almost about a week ago. Cardiology was consulted for elevated cardiac enz.    The patient has been having worsening low back pain with pain radiating to her right leg, so she has seen Dr. Oliva Bustard, who has started her on a prednisone taper for 12 days and on gabapentin 3 times a day.   the patient's daughter started giving her Prilosec for the last three days with not much improvement. The pain went up to as high as 8 out of 10, the day of admission.   In teh ER,  Chest x-ray does showed a moderately large hiatal hernia.  She complained of nausea and an episode of vomiting associated with dizziness about three days ago, constipation. No fevers or chills lately. Pain did not radiate.   She was found to have an elevated troponin of 0.16.  troponin in her last admission was also elevated at 0.11.    Present Illness . SOCIAL HISTORY: She lives at home by herself, pretty active at baseline. No history of any alcohol or smoking.   FAMILY HISTORY: Mom with brain tumor, died in her 35s. Father lived up to 47 and passed probably from old age versus a massive heart attack.   Physical Exam:   GEN WD, WN, NAD    HEENT red conjunctivae    NECK supple  No masses    RESP normal resp effort  clear BS    CARD Regular rate and rhythm  No murmur    ABD denies tenderness  soft    LYMPH negative axillae    EXTR negative edema    SKIN normal to palpation    NEURO motor/sensory function intact    PSYCH alert, A+O to time, place, person, good insight   Review of Systems:   Subjective/Chief Complaint ABD discomfort    General: No  Complaints    Skin: No Complaints    ENT: No Complaints    Eyes: No Complaints    Neck: No Complaints    Respiratory: No Complaints    Cardiovascular: No Complaints    Gastrointestinal: ABD discomfort, upper, between breasts    Genitourinary: No Complaints    Vascular: No Complaints    Musculoskeletal: No Complaints    Neurologic: No Complaints    Hematologic: No Complaints    Endocrine: No Complaints    Psychiatric: No Complaints    Review of Systems: All other systems were reviewed and found to be negative    Medications/Allergies Reviewed Medications/Allergies reviewed   EKG:   Interpretation EKG shows NSR with rate of 71 bpm, nonspecific T wave ABn in V3 to V5    Sulfa drugs: Rash  Morphine: Hives  Augmentin: Rash  Triamterene/ HCTZ: Swelling  PCN: Rash  Apple: Rash  Peach: Rash  Vicodin: Unknown  UAA: Unknown  Soy: Unknown  Vital Signs/Nurse's Notes: **Vital Signs.:   27-Jan-13 09:56   Vital Signs Type Q 4hr   Temperature Temperature (F) 97.4   Celsius 36.3   Temperature Source oral   Pulse Pulse 62   Pulse source per Dinamap   Respirations Respirations 20   Systolic BP Systolic BP 025   Diastolic BP (mmHg) Diastolic BP (mmHg) 62   Mean BP  79   BP Source Dinamap   Pulse Ox % Pulse Ox % 98   Pulse Ox Activity Level  At rest   Oxygen Delivery Room Air/ 21 %     Impression 81 year old Caucasian female with PMH significant for chronic kidney disease, stage III,  myeloproliferative disorder with thrombocytosis, on chronic hydroxyurea treatment, h/o gastric ulcers 2011, chronic low back pain, hypertension, hyperlipidemia, comes to the hospital complaining of epigastric pain, elevated cardiac enz.  CXR shows hialtal hernia.  Negative stress test July 2011 ECHO showing normal EF, moderate LVH, diastolic dysfunction.  1)Elevated cardiac enz: Borderline elevation, suspect secondary to underlying moderate LVH and diastolic dysfunction. previous  negative stress test. Sx of tightness more concerning for hialtal hernia,  Unable to exclude underlying GI pathology. EGD scheduled on monday. no further cardiac workup at this time. on ASA as an outpt  2)  Thrombocytosis, anemia. On hydroxyurea. Managed by hematology on ASA as an outpt  3) CKD: Stable.  4) Epigastric pain, h/o gastric ulcers, hiatal hernia EGD tomorrow.   Electronic Signatures: Ida Rogue (MD)  (Signed 27-Jan-13 13:17)  Authored: General Aspect/Present Illness, History and Physical Exam, Review of System, Home Medications, EKG , Allergies, Vital Signs/Nurse's Notes, Impression/Plan   Last Updated: 27-Jan-13 13:17 by Ida Rogue (MD)

## 2014-07-28 NOTE — Consult Note (Signed)
Chief Complaint:   Subjective/Chief Complaint Epig pain better. Tolerated solids yest.   VITAL SIGNS/ANCILLARY NOTES: **Vital Signs.:   27-Jan-13 07:45   Vital Signs Type Telemetry   Pulse Pulse 60   Pulse source per Telemetry Clerk   Telemetry pattern Cardiac Rhythm Normal sinus rhythm; pattern reported by Telemetry Clerk   Brief Assessment:   Cardiac Regular    Respiratory clear BS    Gastrointestinal mild epig tenderness   Routine Hem:  26-Jan-13 01:06    WBC (CBC) 9.6   RBC (CBC) 2.49   Hemoglobin (CBC) 9.4   Hematocrit (CBC) 28.5   Platelet Count (CBC) 813   MCV 114   MCH 37.9   MCHC 33.1   RDW 15.4  Routine Chem:  26-Jan-13 01:06    Glucose, Serum 86   BUN 22   Creatinine (comp) 1.27   Sodium, Serum 138   Potassium, Serum 4.1   Chloride, Serum 100   CO2, Serum 27   Calcium (Total), Serum 8.4   Osmolality (calc) 278   eGFR (African American) 52   eGFR (Non-African American) 43   Anion Gap 11  Cardiac:  26-Jan-13 01:06    Troponin I 0.17   CK, Total 30   CPK-MB, Serum 1.2  Routine Hem:  26-Jan-13 01:06    Neutrophil % 66.0   Lymphocyte % 20.3   Monocyte % 10.4   Eosinophil % 3.1   Basophil % 0.2   Neutrophil # 6.3   Lymphocyte # 1.9   Monocyte # 1.0   Eosinophil # 0.3   Basophil # 0.0   Assessment/Plan:  Assessment/Plan:   Assessment Epig pain. Better. On PPI.    Plan Pt lives alone. NOt sure she can get a ride for outpt endoscopy. Recommend keeping her until tomorrow for EGD. Pt can be discharged after EGD tomorrow. Thanks   Electronic Signatures: Verdie Shire (MD)  (Signed 27-Jan-13 08:34)  Authored: Chief Complaint, VITAL SIGNS/ANCILLARY NOTES, Brief Assessment, Lab Results, Assessment/Plan   Last Updated: 27-Jan-13 08:34 by Verdie Shire (MD)

## 2014-07-28 NOTE — Consult Note (Signed)
PATIENT NAME:  Tanya Rice, SPIKE MR#:  [id number] DATE OF BIRTH:  06/09/1926  DATE OF CONSULTATION:  07/08/2011  REFERRING PHYSICIAN:  ER CONSULTING PHYSICIAN:  Micheline Maze, MD  ADMITTING PHYSICIAN:  [facility] PRIMARY CARE PHYSICIAN:  Dr. Ronette Deter   CHIEF COMPLAINT: Abdominal pain.   BRIEF HISTORY: Tanya Rice is a complicated 81 year old woman admitted to the hospital with a week to 10 days of abdominal pain. She was recently admitted in January 2013 with similar abdominal pain symptoms. Work-up at that time suggested a gastric ulcer. She was felt to have significant GI disease as the source of her abdominal pain. She had similar symptoms in July 2011 and was again found to have gastric ulcers at that time.   The current problem had been going on at least 7 to 10 days' time without nausea or vomiting, but associated with constipation and anorexia. She has not eaten well for the last several days. Daughter is with her this evening and complains about her inability to eat normally.   PAST MEDICAL HISTORY: The patient has underlying significant medical problems including chronic kidney disease with a baseline creatinine of 1.5 to 1.6, significant thrombocytosis, hypertension, and hyperlipidemia. She was recently stopped on her chronic hydroxyurea therapy. She was noted to have a significantly large hiatal hernia on her previous evaluation. She had mildly elevated troponin on her last admission in addition. She had a cardiac evaluation by Dr. Esmond Plants and was noted to have an ejection fraction of approximately 55% and it was not felt that elevated troponin was the source of her epigastric pain. She also has history of Clostridium difficile colitis in the past with no current symptoms. She had recently seen her primary care physician and was treated for urinary tract infection with multiple antibiotics and was recently on Cipro. She has had blood work done in the last two days; we do not  have any of those numbers available at the present time.   PAST SURGICAL HISTORY: Significant for bilateral inguinal hernias, left shoulder rotator cuff surgery, and she has had cataract surgery.   ALLERGIES: She is allergic to penicillin, morphine, sulfa drugs, hydrochlorothiazide, and codeine.   SOCIAL HISTORY:  She lives independently at this time and has no alcohol or smoking history.   CURRENT MEDICATIONS:  1. Cipro 500 mg p.o. q.12 hours.  2. Gabapentin 100 mg p.o. t.i.d. 3. Lunesta 2 mg p.o. at bedtime.  4. Meclizine 25 mg p.o. t.i.d.  5. Toprol 25 mg, 1/2 tablet b.i.d.  6. Protonix 40 mg p.o. daily.  7. Synthroid 0.025 mg p.o. daily.  8. Centrum Silver. 9. Caltrate.   PHYSICAL EXAMINATION: VITAL SIGNS: Blood pressure 130/61, her heart rate is 88 and regular, oxygen saturation 95% on room air and her temperature is 98.1.   HEENT: Exam reveals no deformities. No scleral icterus. Equally round pupils.   NECK: Supple without adenopathy. Trachea is midline.   CHEST: Clear with no adventitious sounds.   CARDIAC: No murmurs or gallops. She seems to be in normal sinus rhythm.   ABDOMEN: Her abdomen is distended and tense with moderate guarding diffusely. There is no rebound and no acute abdominal findings. She does have active bowel sounds. No bruits are noted. She has good femoral and distal pulses.   PSYCHIATRIC: No affect or orientation problems.   LABORATORY DATA: Work-up in the Emergency Room revealed some markedly abnormal lab values. White blood cell count was noted to be 30,000 with hemoglobin 9.1. Platelet  count was 794,000. Chemistries demonstrated elevated creatinine at 1.4 with GFR of 37. Sodium was 122 with a chloride of 90 and  CO2 19. Bilirubin is normal. Liver function studies are slightly elevated. Albumin was depressed at 2.8. Lipase was normal. Ultrasound of the abdomen was performed which demonstrated probable portal vein thrombosis. This diagnosis has not been  identified in the past.  She has not had a CT scan and one is not planned currently because of her elevated baseline creatinine. Vascular surgery service was consulted by the Emergency Room physicians who recommended admission and possible anticoagulation with followup with general surgery.   ASSESSMENT: This woman does have a significant abdominal problem and I am concerned that she may have some bowel ischemia from her portal thrombosis. Anticoagulation is a good option at this point, but with a recent history of peptic ulcer disease she may have bleeding problems related to that therapy. With her depressed CO2 I suspect she probably has some mild ischemia and we should look at a lactate level  to see what her current acidosis level is. There are no acute surgical indications and there really is no surgical therapy for portal vein thrombosis. The thrombosis may be related to the reduction of her hydroxyurea therapy in the recent past, but that is certainly impossible to be certain. We will continue to follow her. The only surgical option would be to consider bowel resection if she develops signs consistent with bowel necrosis or possible perforation. I would recommend additional plain films in order to assess her bowel gas pattern at the present time. I have discussed the plan with the [facility] physician on call, Dr. Inez Catalina, and we will be available to assist in her management.    ____________________________ Micheline Maze, MD rle:bjt D: 07/08/2011 00:07:29 ET T: 07/08/2011 10:06:55 ET JOB#: [id number]  cc: Rodena Goldmann III, MD, <Dictator> Rodena Goldmann MD ELECTRONICALLY SIGNED 07/16/2011 7:24

## 2014-07-28 NOTE — Consult Note (Signed)
EGD showed a large hiatal hernia plus clean based gastric ulcer in upper part of stomach. Bx taken for inflammation and H.pylori. Reg diet ordered. Daily PPI x 6-8 wks. [place] for discharge from GI point of view. Will sign off. Thanks  Electronic Signatures: Verdie Shire (MD)  (Signed on 28-Jan-13 11:52)  Authored  Last Updated: 28-Jan-13 11:52 by Verdie Shire (MD)

## 2014-07-28 NOTE — Discharge Summary (Signed)
PATIENT NAME:  Tanya Rice, Tanya Rice MR#:  [id number] DATE OF BIRTH:  [birth year]  DATE OF ADMISSION:  07/08/2011 DATE OF DISCHARGE:  07/14/2011   PRIMARY CARE PHYSICIAN: Ronette Deter, MD   PRIMARY ONCOLOGIST: Dr. Oliva Bustard   REASON FOR ADMISSION: Abdominal pain, nausea, poor appetite, weight loss.   DISCHARGE DIAGNOSES: 1. Portal vein thrombosis with abnormal liver function tests likely secondary to portal vein thrombosis.  2. Myeloproliferative disorder with leukocytosis, anemia, and thrombocytosis, followed by Dr. Oliva Bustard. 3. Stage III chronic kidney disease. 4. History of gastric ulcer. 5. History of left foot and heel pain of unclear etiology, clinically improved.  6. History of anemia of chronic disease.  7. Hospital admission January 2013 for epigastric pain secondary to gastric ulcer.  8. History of urinary tract infection.  9. Minimally elevated troponin felt to be due to poor renal clearance rather than acute coronary syndrome.  CONSULTATIONS:  1. Surgical with Dr. Pat Patrick and Dr. Burt Knack  2. Oncology with Dr. Oliva Bustard    DISCHARGE DISPOSITION: Home with home health PT and nursing.   DISCHARGE MEDICATIONS:  1. Coumadin 2.5 mg p.o. daily starting 07/15/2011 unless advised differently by Dr. Oliva Bustard.  2. Hydroxyurea 500 mg p.o. b.i.d.  3. Metoprolol 25 mg p.o. b.i.d.   4. Caltrate 600 mg Plus Vitamin D 400 international units 1 tablet p.o. daily. 5. Synthroid 25 mcg daily. 6. Centrum Silver 1 tablet p.o. daily.   7. Meclizine 25 mg p.o. t.i.d. p.r.n.  8. Protonix 40 mg p.o. b.i.d.  9. Lunesta 2 mg p.o. at bedtime.  10. Gabapentin 100 mg p.o. t.i.d.    DISCHARGE CONDITION: Improved, stable.  DISCHARGE ACTIVITY: As tolerated. Use rolling walker for assistance with ambulation.   DISCHARGE DIET: Low sodium, low fat, low cholesterol.   DISCHARGE INSTRUCTIONS:  1. Take medications as prescribed.  2. Return to the Emergency Department for recurrence of symptoms or for worsening  abdominal pain, nausea, vomiting, or for any bleeding complications or for any fevers or chills.  FOLLOW-UP INSTRUCTIONS:  1. Follow-up with Dr. Oliva Bustard at [facility] tomorrow, 07/15/2011. The patient needs repeat PT/INR tomorrow, 07/15/2011, and repeat CBC within one week per Dr. Oliva Bustard. 2. Follow-up with Dr. Ronette Deter within 1 to 2 weeks. The patient needs repeat LFTs within one week.   PROCEDURES:  1. Abdominal ultrasound 07/07/2011 findings consistent with portal vein thrombosis. Chronic renal disease evident as well. 2. Abdominal x-ray 07/08/2011 bowel gas pattern is nonspecific. Moderate amount of gas throughout the colon. No evidence of bowel obstruction currently.  3. Abdominal x-ray 07/09/2011 nonspecific and nonobstructive bowel gas pattern.  4. Left lower extremity venous Doppler ultrasound 07/10/2011 negative for DVT.  5. Left foot x-ray 07/11/2011 no acute findings.   PERTINENT LABORATORY DATA: INR was 1.3 on 07/09/2011; 3.3 on the day of discharge. CBC on admission WBC 30.5, hemoglobin 9.1, hematocrit 28, platelets 794, MCV 105. CBC on the day of discharge WBC 12.3, hemoglobin 8.4, hematocrit 25.7, platelets 498. Hemoglobin fell to a low of 7.7 07/10/2011 after which she was transfused 2 units of packed red blood cells.   Serum TSH 1.6. LFTs on admission total protein 7.4, albumin 2.8, total bilirubin 0.8, AST 79, ALT 157, alkaline phosphatase 141. LFTs at the time of discharge total protein 5.7, albumin 2.2, total bilirubin 0.2, AST 39, ALT 42, alkaline phosphatase 86. Cardiac enzymes troponin on admission 0.07; second troponin 0.06; third troponin 0.07. Renal function on admission with BUN 19, creatinine 1.44; BUN 12,  creatinine 1.26 at the time of discharge. Urine culture from 07/11/2011 no growth to date.  BRIEF HISTORY/HOSPITAL COURSE: The patient is an 81 year old female with past history of hypertension, hypothyroidism, myeloproliferative  disorder with chronic thrombocytosis, chronic anemia, and leukocytosis as well as history of gastric ulcer and recent urinary tract infection who presented to the Emergency Department with complaints of abdominal pain, poor p.o. intake, poor appetite, some nausea and weight loss. Please see dictated admission history and physical for pertinent details surrounding the onset of this hospitalization. Please see below for further details.  1. Abdominal pain, nausea, poor p.o. intake, poor appetite. The patient was noted to have abnormal LFTs. Abdominal ultrasound was obtained revealing portal vein thrombosis. Her portal vein thrombosis was felt to be due to thrombocytosis in the setting of myeloproliferative disorder. She was started on anticoagulation initially on heparin drip and later Coumadin was added. With these measures her overall clinical condition has improved with resolution of her abdominal pain and nausea and her appetite has also picked up and her nausea has resolved. She's had no vomiting and she was tolerating a normal consistency diet well at the time of discharge without any complications. Her LFTs have improved with anticoagulation. Once her INR was noted to have become therapeutic, her heparin drip was stopped and she was maintained on Coumadin. INR is slightly supratherapeutic on the day of discharge and she was advised to hold her Coumadin on the day of discharge and advised to start taking low dose 2.5 mg of Coumadin per day starting the day after discharge unless she is further advised to take a different dose by Dr. Oliva Bustard. She will follow-up at the [facility] tomorrow with Dr. Oliva Bustard and have her INR repeated and further adjustments will be made to her Coumadin dosing accordingly per Dr. Metro Kung recommendations.  2. In regards to myeloproliferative disorder, she has been restarted on hydroxyurea to help with her thrombocytosis as well as leukocytosis and her platelet and WBC count have  improved after doing so. She does have chronic anemia and had some drop of her hemoglobin which could have been dilutional but since she was symptomatic with some generalized weakness it  was felt to be in her best interest to have her transfused blood per Dr. Metro Kung recommendations and after doing so her weakness has significantly improved. Her hemoglobin and hematocrit have improved thereafter.  3. In regard to her portal vein thrombosis which will require anticoagulation as well as her myeloproliferative disorder with abnormal CBC, she will follow-up with Dr. Oliva Bustard closely as an outpatient at the [facility]. She will also need to have her LFTs repeated in one week per her primary care physician. Overall her LFTs have significantly improved as have her admitting symptoms. Initially there was a concern for possible bowel ischemia for which surgical consultation was obtained and. Two abdominal x-rays revealed nonspecific nonobstructive bowel gas pattern and, per Dr. Pat Patrick and Dr. Burt Knack, there are no surgical indications at this time and surgeons were in agreement with anticoagulation for her portal vein thrombosis.  4. Left foot pain which has been persistent since January of 2013 of unclear etiology. Foot x-ray and lower extremity Doppler ultrasound were unremarkable. Apparently she's had an outpatient neurologic workup and evaluation as well and has underwent nerve conduction studies per her daughter which were apparently benign. She has not had any trauma. Not sure if this is arthritic or gouty in nature. Her uric acid was slightly also elevated at 6.4 but she  did not require any specific treatment for gout. Septic joint was felt to be less likely as she has been afebrile. Not sure if she has arterial insufficiency but this was felt to be less likely as her DP and PT pulses were 2+ on physical exam on the left foot. Arterial Doppler's were not performed at our hospital. The patient may require referral to  Vascular Surgery as an outpatient if there is a concern for arterial insufficiency but this will be deferred to her primary care physician. She was placed on supportive care and pain control and did well and her left foot and heel pain significantly improved and she did well per PT evaluation with rolling walker. PT recommends having her discharged home with home health PT and nursing which have been arranged for this patient. Overall her pain and mobility have improved with p.r.n. pain control.  5. Recent gastric ulcer for which the patient will continue b.i.d. PPI therapy. 6. Stage III CKD which has remained at baseline and she may have had an acute prerenal component due to volume depletion from poor p.o. intake and her renal function has improved with IV fluids and she is now euvolemic at the time of discharge. She has been encouraged and advised to maintain adequate oral hydration and p.o. intake. Renal ultrasound showed some cortical thinning and chronic medical renal disease but her renal function has improved overall. Nephrotoxins were avoided. She did have minimally elevated troponin without any chest pain and this was felt to be due to poor renal clearance rather than acute coronary syndrome. She will need close monitoring of her renal function as an outpatient and if she is noted to have worsening renal failure or if her CKD progresses, recommend referral to Nephrology as an outpatient which can be arranged by her primary care physician.  7. Hypertension. Blood pressure has been well controlled during this hospitalization. The patient will continue metoprolol.  8. Hypothyroidism. The patient is to continue Synthroid. TSH was within normal limits.  9. Recent urinary tract infection. Repeat urine culture during this hospitalization did not reveal any growth to date. She was initially on empiric IV antibiotics and has received a week's course of antibiotics and antibiotics are being discontinued at the  time of discharge and there are no concerns for urinary tract infection at this time or any obvious sources of infection.   On 07/14/2011, the patient was hemodynamically stable without any abdominal pain, nausea, and her appetite had significantly improved and did not have any nausea or vomiting and she was felt to be stable for discharge home with home health, PT, nursing with close outpatient follow-up to which the patient was agreeable and it was also felt to be safe from a surgical and Oncology standpoint to have the patient discharged home.  TIME SPENT ON DISCHARGE: Greater than 30 minutes.   ____________________________ Romie Jumper, MD knl:drc D: 07/16/2011 23:34:54 ET T: 07/19/2011 11:19:48 ET JOB#: [id number]  cc: Romie Jumper, MD, <Dictator> Eduard Clos. Gilford Rile, MD Martie Lee. Oliva Bustard, MD Romie Jumper MD ELECTRONICALLY SIGNED 07/27/2011 17:50
